# Patient Record
Sex: Female | Born: 1995 | Race: Black or African American | Hispanic: No | Marital: Single | Smoking: Never smoker
Health system: Southern US, Community
[De-identification: ages and names within clinical notes are randomized; demographics above are authoritative.]

## PROBLEM LIST (undated history)

## (undated) DIAGNOSIS — F419 Anxiety disorder, unspecified: Secondary | ICD-10-CM

## (undated) DIAGNOSIS — G43909 Migraine, unspecified, not intractable, without status migrainosus: Secondary | ICD-10-CM

## (undated) DIAGNOSIS — K219 Gastro-esophageal reflux disease without esophagitis: Secondary | ICD-10-CM

## (undated) HISTORY — PX: TONSILLECTOMY: SUR1361

## (undated) HISTORY — PX: ADENOIDECTOMY: SUR15

---
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
## 2018-09-09 ENCOUNTER — Encounter: Payer: Self-pay | Admitting: Emergency Medicine

## 2018-09-09 ENCOUNTER — Ambulatory Visit
Admission: EM | Admit: 2018-09-09 | Discharge: 2018-09-09 | Disposition: A | Discharge status: Home / Self Care | Payer: BLUE CROSS/BLUE SHIELD | Attending: Family Medicine | Admitting: Family Medicine

## 2018-09-09 ENCOUNTER — Other Ambulatory Visit: Payer: Self-pay

## 2018-09-09 DIAGNOSIS — N760 Acute vaginitis: Secondary | ICD-10-CM

## 2018-09-09 DIAGNOSIS — B9689 Other specified bacterial agents as the cause of diseases classified elsewhere: Secondary | ICD-10-CM | POA: Diagnosis not present

## 2018-09-09 LAB — WET PREP, GENITAL
SPERM: NONE SEEN
TRICH WET PREP: NONE SEEN
Yeast Wet Prep HPF POC: NONE SEEN

## 2018-09-09 LAB — CHLAMYDIA/NGC RT PCR (ARMC ONLY)
Chlamydia Tr: NOT DETECTED
N gonorrhoeae: NOT DETECTED

## 2018-09-09 MED ORDER — METRONIDAZOLE 500 MG PO TABS: 500.0000 mg | ORAL_TABLET | Freq: Two times a day (BID) | ORAL | 0 refills | Status: DC

## 2018-09-09 NOTE — Discharge Instructions (Addendum)
Take medication as prescribed. Rest. Drink plenty of fluids.  ° °Follow up with your primary care physician this week as needed. Return to Urgent care for new or worsening concerns.  ° °

## 2018-09-09 NOTE — ED Triage Notes (Signed)
Patient c/o vaginal discharge and vaginal itching that started after her period ended couple of days ago.

## 2018-09-09 NOTE — ED Provider Notes (Signed)
[facility] ____________________________________________  Time seen: Approximately 8:31 PM  I have reviewed the triage vital signs and the nursing notes.   HISTORY  Chief Complaint Vaginal Discharge   HPI Claudia Morris is a 22 y.o. female presents for evaluation of vaginal itching.  Patient reports she just completed her menstrual cycle, and reports just prior to mental cycle she noticed some vaginal discharge.  States it then resolved and since finishing her menstrual she is having some vaginal itching.  Denies any rash, sores, current discharge, vaginal pain, abdominal pain or back pain.  No accompanying fevers.  Reports sexually active with same partner, request to have gonorrhea and Chlamydia testing completed but denies other concerns of other STDs and declines other STD testing.  Denies concerns of pregnancy.  No urinary frequency, urinary urgency or burning with urination.  Denies aggravating or alleviating factors.  Reports otherwise doing well denies other complaints.  No pain at this time.  [employer] [facility] [employer]: PCP Patient's last menstrual period was 09/02/2018 (exact date).Denies pregnancy.    History reviewed. No pertinent past medical history.  There are no active problems to display for this patient.   Past Surgical History:  Procedure Laterality Date  . ADENOIDECTOMY    . TONSILLECTOMY       No current facility-administered medications for this encounter.   Current Outpatient Medications:  .  Norgestimate-Ethinyl Estradiol Triphasic (TRI-SPRINTEC) 0.18/0.215/0.25 MG-35 MCG tablet, Take 1 tablet by mouth daily., Disp: , Rfl:  .  metroNIDAZOLE (FLAGYL) 500 MG tablet, Take 1 tablet (500 mg total) by mouth 2 (two) times daily., Disp: 14 tablet, Rfl: 0  Allergies Patient has no known allergies.  Family History  Problem Relation Age of Onset  . Diabetes Mother   . Hypertension Father     Social History Social History   Tobacco Use    . Smoking status: Never Smoker  . Smokeless tobacco: Never Used  Substance Use Topics  . Alcohol use: Never    Frequency: Never  . Drug use: Never    Review of Systems Constitutional: No fever Cardiovascular: Denies chest pain. Respiratory: Denies shortness of breath. Gastrointestinal: No abdominal pain.  No nausea, no vomiting.  No diarrhea.   Genitourinary: Negative for dysuria. Musculoskeletal: Negative for back pain. Skin: Negative for rash.   ____________________________________________   PHYSICAL EXAM:  VITAL SIGNS: ED Triage Vitals  Enc Vitals Group     BP 09/09/18 1937 136/86     Pulse Rate 09/09/18 1937 97     Resp 09/09/18 1937 14     Temp 09/09/18 1937 98.3 F (36.8 C)     Temp Source 09/09/18 1937 Oral     SpO2 09/09/18 1937 100 %     Weight 09/09/18 1934 175 lb (79.4 kg)     Height 09/09/18 1934 5\' 7"  (1.702 m)     Head Circumference --      Peak Flow --      Pain Score 09/09/18 1934 0     Pain Loc --      Pain Edu? --      Excl. in GC? --     Constitutional: Alert and oriented. Well appearing and in no acute distress. ENT      Head: Normocephalic and atraumatic. Cardiovascular: Normal rate, regular rhythm. Grossly normal heart sounds.  Good peripheral circulation. Respiratory: Normal respiratory effort without tachypnea nor retractions. Breath sounds are clear and equal bilaterally. No wheezes, rales, rhonchi. Gastrointestinal: Soft and nontender. No CVA  tenderness. Musculoskeletal:  No midline cervical, thoracic or lumbar tenderness to palpation. Neurologic:  Normal speech and language. Speech is normal. No gait instability.  Skin:  Skin is warm, dry Psychiatric: Mood and affect are normal. Speech and behavior are normal. Patient exhibits appropriate insight and judgment   ___________________________________________   LABS (all labs ordered are listed, but only abnormal results are displayed)  Labs Reviewed  WET PREP, GENITAL - Abnormal;  Notable for the following components:      Result Value   Clue Cells Wet Prep HPF POC PRESENT (*)    WBC, Wet Prep HPF POC FEW (*)    All other components within normal limits  CHLAMYDIA/NGC RT PCR ([facility] ONLY)    PROCEDURES Procedures   INITIAL IMPRESSION / ASSESSMENT AND PLAN / ED COURSE  Pertinent labs & imaging results that were available during my care of the patient were reviewed by me and considered in my medical decision making (see chart for details).  Well-appearing patient.  No acute distress.  Reason for evaluation of vaginal itching.  Request to have gonorrhea and Chlamydia testing performed, but declines any other STD testing at this time.  Patient declines pelvic, elected self wet prep.  Wet prep positive for clue cells.  Discussed treatment options with patient.  Will treat with oral Flagyl.  Encourage rest, fluids, supportive care, pelvic rest. Discussed indication, risks and benefits of medications with patient.  Discussed follow up and return parameters including no resolution or any worsening concerns. Patient verbalized understanding and agreed to plan.   ____________________________________________   FINAL CLINICAL IMPRESSION(S) / ED DIAGNOSES  Final diagnoses:  BV (bacterial vaginosis)     ED Discharge Orders         Ordered    metroNIDAZOLE (FLAGYL) 500 MG tablet  2 times daily     09/09/18 1958           Note: This dictation was prepared with Dragon dictation along with smaller phrase technology. Any transcriptional errors that result from this process are unintentional.         Renford Dills, NP 09/09/18 2034

## 2019-05-01 ENCOUNTER — Ambulatory Visit
Admission: EM | Admit: 2019-05-01 | Discharge: 2019-05-01 | Disposition: A | Discharge status: Home / Self Care | Payer: BLUE CROSS/BLUE SHIELD | Attending: Family Medicine | Admitting: Family Medicine

## 2019-05-01 ENCOUNTER — Encounter: Payer: Self-pay | Admitting: Emergency Medicine

## 2019-05-01 ENCOUNTER — Other Ambulatory Visit: Payer: Self-pay

## 2019-05-01 DIAGNOSIS — F419 Anxiety disorder, unspecified: Secondary | ICD-10-CM

## 2019-05-01 HISTORY — DX: Gastro-esophageal reflux disease without esophagitis: K21.9

## 2019-05-01 HISTORY — DX: Anxiety disorder, unspecified: F41.9

## 2019-05-01 NOTE — Discharge Instructions (Signed)
Discuss increasing or changing your medication with your doctor.  Consider seeing a counselor/therapist - [facility].  Take care  Dr. Lacinda Axon

## 2019-05-01 NOTE — ED Triage Notes (Signed)
Patient c/o feeling anxious for the past 2-3 days. Patient states that her anxiety was worse today.  Patient reports pain in her left shoulder. Patient currently is on medication for anxiety.

## 2019-05-02 NOTE — ED Provider Notes (Signed)
[facility]    CSN: [id number] Arrival date & time: 05/01/19  1539  History   Chief Complaint Chief Complaint  Patient presents with  . Anxiety   HPI   23 year old female presents with anxiety, chest pain/tightness, and left shoulder pain.  The patient's third visit this month.  She has been seen for same symptoms in the ER on 6/12 and 6/25.  Patient has underlying anxiety states that she is followed by primary care physician.  She states that she is currently on mirtazapine for anxiety.  Patient states that for the past 2 to 3 days she has had worsening anxiety.  She feels very anxious.  She states that she is unsure why.  She does note that she is worried about school and her future.  Patient endorses some recent chest tightness which she states is currently improved.  She also has ongoing left shoulder pain.  Patient thought it best that she be examined.  Her pain is currently 5/10 in severity.  Denies shortness of breath.  No other reported symptoms.  No other complaints.  PMH, Surgical Hx, Family Hx, Social History reviewed and updated as below.  Past Medical History:  Diagnosis Date  . Anxiety   . GERD (gastroesophageal reflux disease)    Past Surgical History:  Procedure Laterality Date  . ADENOIDECTOMY    . TONSILLECTOMY      OB History   No obstetric history on file.    Home Medications    Prior to Admission medications   Medication Sig Start Date End Date Taking? Authorizing Provider  Norgestimate-Ethinyl Estradiol Triphasic (TRI-SPRINTEC) 0.18/0.215/0.25 MG-35 MCG tablet Take 1 tablet by mouth daily.   Yes [provider]  pantoprazole (PROTONIX) 40 MG tablet  04/30/19  Yes [provider]  metroNIDAZOLE (FLAGYL) 500 MG tablet Take 1 tablet (500 mg total) by mouth 2 (two) times daily. 09/09/18   Marylene Land, NP    Family History Family History  Problem Relation Age of Onset  . Diabetes Mother   . Hypertension Father      Social History Social History   Tobacco Use  . Smoking status: Never Smoker  . Smokeless tobacco: Never Used  Substance Use Topics  . Alcohol use: Never    Frequency: Never  . Drug use: Never     Allergies   Patient has no known allergies.   Review of Systems Review of Systems  Respiratory: Positive for chest tightness.   Musculoskeletal:       Left shoulder pain.  Psychiatric/Behavioral: The patient is nervous/anxious.    Physical Exam Triage Vital Signs ED Triage Vitals  Enc Vitals Group     BP 05/01/19 1601 (!) 131/92     Pulse Rate 05/01/19 1601 (!) 129     Resp 05/01/19 1601 16     Temp 05/01/19 1601 98.4 F (36.9 C)     Temp Source 05/01/19 1601 Oral     SpO2 05/01/19 1601 99 %     Weight 05/01/19 1557 155 lb (70.3 kg)     Height 05/01/19 1557 5\' 7"  (1.702 m)     Head Circumference --      Peak Flow --      Pain Score 05/01/19 1557 5     Pain Loc --      Pain Edu? --      Excl. in [place]? --    Updated Vital Signs BP (!) 131/92 (BP Location: Left Arm)   Pulse (!) 129  Temp 98.4 F (36.9 C) (Oral)   Resp 16   Ht 5\' 7"  (1.702 m)   Wt 70.3 kg   LMP 04/10/2019 (Approximate)   SpO2 99%   BMI 24.28 kg/m   Visual Acuity Right Eye Distance:   Left Eye Distance:   Bilateral Distance:    Right Eye Near:   Left Eye Near:    Bilateral Near:     Physical Exam Vitals signs and nursing note reviewed.  Constitutional:      Appearance: Normal appearance.     Comments: Patient appears very anxious but is in no acute distress.  HENT:     Head: Normocephalic and atraumatic.  Eyes:     General:        Right eye: No discharge.        Left eye: No discharge.     Conjunctiva/sclera: Conjunctivae normal.  Cardiovascular:     Rate and Rhythm: Regular rhythm. Tachycardia present.  Pulmonary:     Effort: Pulmonary effort is normal.     Breath sounds: Normal breath sounds. No wheezing or rales.  Musculoskeletal:     Comments: Patient with tenderness along  left trapezius.  Neurological:     Mental Status: She is alert.  Psychiatric:     Comments: Patient very anxious.  Psychomotor agitation noted.    UC Treatments / Results  Labs (all labs ordered are listed, but only abnormal results are displayed) Labs Reviewed - No data to display  EKG None  Radiology No results found.  Procedures Procedures (including critical care time)  Medications Ordered in UC Medications - No data to display  Initial Impression / Assessment and Plan / UC Course  I have reviewed the triage vital signs and the nursing notes.  Pertinent labs & imaging results that were available during my care of the patient were reviewed by me and considered in my medical decision making (see chart for details).    23 year old female presents with uncontrolled anxiety.  Advised her to follow-up with her primary care physician to discuss changing medication or increasing her current medication.  Advised to see counselor/therapist.  Recommendation given.  Supportive care.  Final Clinical Impressions(s) / UC Diagnoses   Final diagnoses:  Anxiety     Discharge Instructions     Discuss increasing or changing your medication with your doctor.  Consider seeing a counselor/therapist - [facility].  Take care  Dr. Adriana Simasook    ED Prescriptions    None     Controlled Substance Prescriptions [place] Controlled Substance Registry consulted? Not Applicable   Tommie SamsCook, Keiko Myricks G, DO 05/02/19 1014

## 2019-06-03 ENCOUNTER — Ambulatory Visit: Payer: BC Managed Care – PPO | Admitting: Gastroenterology

## 2019-06-03 ENCOUNTER — Other Ambulatory Visit: Payer: Self-pay

## 2019-06-10 ENCOUNTER — Ambulatory Visit (INDEPENDENT_AMBULATORY_CARE_PROVIDER_SITE_OTHER): Payer: BC Managed Care – PPO

## 2019-06-10 ENCOUNTER — Other Ambulatory Visit: Payer: Self-pay

## 2019-06-10 ENCOUNTER — Ambulatory Visit
Admission: EM | Admit: 2019-06-10 | Discharge: 2019-06-10 | Disposition: A | Discharge status: Home / Self Care | Payer: BC Managed Care – PPO | Attending: Family | Admitting: Family

## 2019-06-10 ENCOUNTER — Encounter: Payer: Self-pay | Admitting: Emergency Medicine

## 2019-06-10 DIAGNOSIS — M79662 Pain in left lower leg: Secondary | ICD-10-CM

## 2019-06-10 NOTE — ED Triage Notes (Signed)
Patient c/o left leg pain that started 1 month ago. She states it feels better only when she wears the brace on her leg.

## 2019-06-10 NOTE — ED Notes (Signed)
Ace wrap applied to left lower leg for comfort. Patient tolerated well.

## 2019-06-10 NOTE — Discharge Instructions (Addendum)
Recommend try using an ace wrap for support and comfort. Encouraged to continue Naproxen as needed. Recommend call [facility] today to schedule appointment for further evaluation.

## 2019-06-10 NOTE — ED Provider Notes (Signed)
[facility]    CSN: [id number] Arrival date & time: 06/10/19  0903     History   Chief Complaint Chief Complaint  Patient presents with  . Leg Pain    HPI Claudia Pinesaylor M Morris is a 23 y.o. female.   23 year old female presents with left lower leg pain that started about 1 month ago. No distinct injury but [facility] and they thought she had a muscle strain or possible stress fracture. Was given Naproxen with some relief. Also purchased a lower leg/ankle brace which helps but pain has gotten much worse in the past week. Indicates elevating leg helps but ice and heat has not. Denies any numbness. Concerned over etiology. Requesting x-ray evaluation today. Other chronic health issues include GERD. Currently on Protonix and Tri-Sprintec daily.   The history is provided by the patient.    Past Medical History:  Diagnosis Date  . Anxiety   . GERD (gastroesophageal reflux disease)     There are no active problems to display for this patient.   Past Surgical History:  Procedure Laterality Date  . ADENOIDECTOMY    . TONSILLECTOMY      OB History   No obstetric history on file.      Home Medications    Prior to Admission medications   Medication Sig Start Date End Date Taking? Authorizing Provider  Norgestimate-Ethinyl Estradiol Triphasic (TRI-SPRINTEC) 0.18/0.215/0.25 MG-35 MCG tablet Take 1 tablet by mouth daily.   Yes [provider]  pantoprazole (PROTONIX) 40 MG tablet  04/30/19  Yes [provider]  norgestimate-ethinyl estradiol (ORTHO-CYCLEN) 0.25-35 MG-MCG tablet Take by mouth.  06/10/19  [provider]  omeprazole (PRILOSEC) 20 MG capsule TK ONE C PO D FOR REFLUX 04/16/19 06/10/19  [provider]  sucralfate (CARAFATE) 1 g tablet  04/19/19 06/10/19  [provider]    Family History Family History  Problem Relation Age of Onset  . Diabetes Mother   . Hypertension Father     Social  History Social History   Tobacco Use  . Smoking status: Never Smoker  . Smokeless tobacco: Never Used  Substance Use Topics  . Alcohol use: Never    Frequency: Never  . Drug use: Never     Allergies   Patient has no known allergies.   Review of Systems Review of Systems  Constitutional: Negative for activity change, appetite change, chills, fatigue and fever.  Respiratory: Negative for cough, chest tightness, shortness of breath and wheezing.   Cardiovascular: Negative for chest pain, palpitations and leg swelling.  Gastrointestinal: Negative for nausea and vomiting.  Musculoskeletal: Positive for arthralgias and myalgias. Negative for back pain and joint swelling.  Skin: Negative for color change, rash and wound.  Allergic/Immunologic: Negative for immunocompromised state.  Neurological: Negative for dizziness, tremors, seizures, syncope, weakness, light-headedness, numbness and headaches.  Hematological: Negative for adenopathy. Does not bruise/bleed easily.     Physical Exam Triage Vital Signs ED Triage Vitals  Enc Vitals Group     BP 06/10/19 0927 (!) 138/100     Pulse Rate 06/10/19 0927 86     Resp 06/10/19 0927 18     Temp 06/10/19 0927 98.8 F (37.1 C)     Temp Source 06/10/19 0927 Oral     SpO2 06/10/19 0927 100 %     Weight 06/10/19 0928 155 lb (70.3 kg)     Height 06/10/19 0928 5\' 7"  (1.702 m)     Head  Circumference --      Peak Flow --      Pain Score 06/10/19 0927 7     Pain Loc --      Pain Edu? --      Excl. in GC? --    No data found.  Updated Vital Signs BP (!) 138/100 (BP Location: Right Arm)   Pulse 86   Temp 98.8 F (37.1 C) (Oral)   Resp 18   Ht 5\' 7"  (1.702 m)   Wt 155 lb (70.3 kg)   LMP 05/27/2019   SpO2 100%   BMI 24.28 kg/m   Visual Acuity Right Eye Distance:   Left Eye Distance:   Bilateral Distance:    Right Eye Near:   Left Eye Near:    Bilateral Near:     Physical Exam Vitals signs and nursing note reviewed.   Constitutional:      General: She is awake. She is not in acute distress.    Appearance: Normal appearance. She is well-developed and well-groomed. She is not ill-appearing.     Comments: Patient sitting comfortably on exam table in no acute distress but appears to be in pain and has leg extended on table for comfort.   HENT:     Head: Normocephalic and atraumatic.  Eyes:     Extraocular Movements: Extraocular movements intact.     Conjunctiva/sclera: Conjunctivae normal.  Cardiovascular:     Rate and Rhythm: Normal rate.  Pulmonary:     Effort: Pulmonary effort is normal.  Musculoskeletal: Normal range of motion.        General: Tenderness present.     Right lower leg: No edema.     Left lower leg: She exhibits tenderness. She exhibits no swelling, no deformity and no laceration. No edema.       Legs:     Comments: Has full range of motion of left hip, knee and ankle. Slight pain along anterior central aspect of tibia with Dorsal flexion of left foot. Slightly tender just below tibial tuberosity. No swelling, redness or rash seen. Good distal pulses and capillary refill. No neuro deficits noted.   Skin:    General: Skin is warm and dry.     Capillary Refill: Capillary refill takes less than 2 seconds.     Findings: No erythema or rash.  Neurological:     General: No focal deficit present.     Mental Status: She is alert and oriented to person, place, and time.     Sensory: Sensation is intact.     Motor: Motor function is intact.     Gait: Gait is intact.  Psychiatric:        Mood and Affect: Mood normal.        Behavior: Behavior normal. Behavior is cooperative.        Thought Content: Thought content normal.        Judgment: Judgment normal.      UC Treatments / Results  Labs (all labs ordered are listed, but only abnormal results are displayed) Labs Reviewed - No data to display  EKG   Radiology Dg Tibia/fibula Left  Result Date: 06/10/2019 CLINICAL DATA:   Central LEFT leg pain for over a month worsened in past week, no known injury EXAM: LEFT TIBIA AND FIBULA - 2 VIEW COMPARISON:  None FINDINGS: Osseous mineralization normal. Joint spaces preserved. No fracture, dislocation, or bone destruction. IMPRESSION: Normal exam. Electronically Signed   By: Ulyses SouthwardMark  Boles M.D.   On:  06/10/2019 10:24    Procedures Procedures (including critical care time)  Medications Ordered in UC Medications - No data to display  Initial Impression / Assessment and Plan / UC Course  I have reviewed the triage vital signs and the nursing notes.  Pertinent labs & imaging results that were available during my care of the patient were reviewed by me and considered in my medical decision making (see chart for details).    Reviewed x-ray results with patient- no fracture or other distinct abnormality detected. Discussed that she may still have continued "shin splints" or muscle/tendon strain. Recommend using an ace wrap instead of brace for support. Continue to elevate leg as much as possible and use Naproxen as directed. Recommend contact [facility] today to schedule appointment for further evaluation.  Final Clinical Impressions(s) / UC Diagnoses   Final diagnoses:  Pain in left lower leg     Discharge Instructions     Recommend try using an ace wrap for support and comfort. Encouraged to continue Naproxen as needed. Recommend call [facility] today to schedule appointment for further evaluation.     ED Prescriptions    None     Controlled Substance Prescriptions [place] Controlled Substance Registry consulted? Not Applicable   Katy Apo, NP 06/10/19 1723

## 2019-06-21 ENCOUNTER — Other Ambulatory Visit: Payer: Self-pay

## 2019-06-21 ENCOUNTER — Encounter: Payer: Self-pay | Admitting: Emergency Medicine

## 2019-06-21 ENCOUNTER — Emergency Department: Payer: BC Managed Care – PPO

## 2019-06-21 ENCOUNTER — Emergency Department
Admission: EM | Admit: 2019-06-21 | Discharge: 2019-06-21 | Disposition: A | Discharge status: Home / Self Care | Payer: BC Managed Care – PPO | Attending: Student in an Organized Health Care Education/Training Program | Admitting: Student in an Organized Health Care Education/Training Program

## 2019-06-21 DIAGNOSIS — R0789 Other chest pain: Secondary | ICD-10-CM | POA: Diagnosis not present

## 2019-06-21 DIAGNOSIS — R079 Chest pain, unspecified: Secondary | ICD-10-CM | POA: Diagnosis present

## 2019-06-21 DIAGNOSIS — Z79899 Other long term (current) drug therapy: Secondary | ICD-10-CM | POA: Insufficient documentation

## 2019-06-21 LAB — BASIC METABOLIC PANEL
Anion gap: 5 (ref 5–15)
BUN: 14 mg/dL (ref 6–20)
CO2: 24 mmol/L (ref 22–32)
Calcium: 8.9 mg/dL (ref 8.9–10.3)
Chloride: 107 mmol/L (ref 98–111)
Creatinine, Ser: 0.82 mg/dL (ref 0.44–1.00)
GFR calc Af Amer: >60 mL/min (ref >60)
GFR calc non Af Amer: >60 mL/min (ref >60)
Glucose, Bld: 96 mg/dL (ref 70–99)
Potassium: 4 mmol/L (ref 3.5–5.1)
Sodium: 136 mmol/L (ref 135–145)

## 2019-06-21 LAB — CBC
HCT: 39.1 % (ref 36.0–46.0)
Hemoglobin: 12.8 g/dL (ref 12.0–15.0)
MCH: 29 pg (ref 26.0–34.0)
MCHC: 32.7 g/dL (ref 30.0–36.0)
MCV: 88.5 fL (ref 80.0–100.0)
Platelets: 344 10*3/uL (ref 150–400)
RBC: 4.42 MIL/uL (ref 3.87–5.11)
RDW: 11.6 % (ref 11.5–15.5)
WBC: 8.6 10*3/uL (ref 4.0–10.5)
nRBC: 0 % (ref 0.0–0.2)

## 2019-06-21 LAB — TROPONIN I (HIGH SENSITIVITY)
Troponin I (High Sensitivity): <2 ng/L (ref <18)
Troponin I (High Sensitivity): 2 ng/L (ref <18)

## 2019-06-21 LAB — FIBRIN DERIVATIVES D-DIMER (ARMC ONLY): Fibrin derivatives D-dimer (ARMC): 182.53 ng/mL (FEU) (ref 0.00–499.00)

## 2019-06-21 LAB — PREGNANCY, URINE: Preg Test, Ur: NEGATIVE

## 2019-06-21 MED ORDER — LORAZEPAM 1 MG PO TABS
1.0000 mg | ORAL_TABLET | Freq: Once | ORAL | Status: AC
  Administered 2019-06-21: 1 mg via ORAL
  Filled 2019-06-21: qty 1

## 2019-06-21 NOTE — ED Notes (Signed)
Paper copy of dispo

## 2019-06-21 NOTE — ED Notes (Signed)
Pt ambulatory to restroom to obtain urine sample
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
## 2019-06-21 NOTE — ED Notes (Signed)
PT  VOL °

## 2019-06-21 NOTE — ED Notes (Signed)
Patient transported to X-ray 

## 2019-06-21 NOTE — ED Provider Notes (Signed)
[facility] Emergency Department Provider Note    First MD Initiated Contact with Patient 06/21/19 1705     (approximate)  I have reviewed the triage vital signs and the nursing notes.   HISTORY  Chief Complaint Chest Pain    HPI Claudia Morris is a 23 y.o. female [place] past medical history presents the ER for evaluation of right-sided shoulder and chest pain that started earlier today.  Feels like the right side of her hand is numb.  Symptoms initiated after she learned that her sister was in a car wreck.  States this feels different from previous episodes of anxiety.  No nausea or vomiting.  No abdominal pain.  No recent fevers.    Past Medical History:  Diagnosis Date  . Anxiety   . GERD (gastroesophageal reflux disease)    Family History  Problem Relation Age of Onset  . Diabetes Mother   . Hypertension Father    Past Surgical History:  Procedure Laterality Date  . ADENOIDECTOMY    . TONSILLECTOMY     There are no active problems to display for this patient.     Prior to Admission medications   Medication Sig Start Date End Date Taking? Authorizing Provider  Norgestimate-Ethinyl Estradiol Triphasic (TRI-SPRINTEC) 0.18/0.215/0.25 MG-35 MCG tablet Take 1 tablet by mouth daily.    [provider]  pantoprazole (PROTONIX) 40 MG tablet  04/30/19   [provider]  norgestimate-ethinyl estradiol (ORTHO-CYCLEN) 0.25-35 MG-MCG tablet Take by mouth.  06/10/19  [provider]  omeprazole (PRILOSEC) 20 MG capsule TK ONE C PO D FOR REFLUX 04/16/19 06/10/19  [provider]  sucralfate (CARAFATE) 1 g tablet  04/19/19 06/10/19  [provider]    Allergies Patient has no known allergies.    Social History Social History   Tobacco Use  . Smoking status: Never Smoker  . Smokeless tobacco: Never Used  Substance Use Topics  . Alcohol use: Never    Frequency: Never  . Drug use: Never    Review of  Systems Patient denies headaches, rhinorrhea, blurry vision, numbness, shortness of breath, chest pain, edema, cough, abdominal pain, nausea, vomiting, diarrhea, dysuria, fevers, rashes or hallucinations unless otherwise stated above in HPI. ____________________________________________   PHYSICAL EXAM:  VITAL SIGNS: Vitals:   06/21/19 1650 06/21/19 1822  BP:  132/86  Pulse:  92  Resp:  18  Temp: 98.7 F (37.1 C)   SpO2:  99%    Constitutional: Alert and oriented.  Eyes: Conjunctivae are normal.  Head: Atraumatic. Nose: No congestion/rhinnorhea. Mouth/Throat: Mucous membranes are moist.   Neck: No stridor. Painless ROM.  Cardiovascular: Normal rate, regular rhythm. Grossly normal heart sounds.  Good peripheral circulation. Respiratory: Normal respiratory effort.  No retractions. Lungs CTAB. Gastrointestinal: Soft and nontender. No distention. No abdominal bruits. No CVA tenderness. Genitourinary:  Musculoskeletal: No lower extremity tenderness nor edema.  No joint effusions. Neurologic:  Normal speech and language. No gross focal neurologic deficits are appreciated. No facial droop Skin:  Skin is warm, dry and intact. No rash noted. Psychiatric: Mood and affect are normal. Speech and behavior are normal.  ____________________________________________   LABS (all labs ordered are listed, but only abnormal results are displayed)  Results for orders placed or performed during the hospital encounter of 06/21/19 (from the past 24 hour(s))  Basic metabolic panel     Status: None   Collection Time: 06/21/19  4:52 PM  Result Value Ref Range   Sodium 136 135 -  145 mmol/L   Potassium 4.0 3.5 - 5.1 mmol/L   Chloride 107 98 - 111 mmol/L   CO2 24 22 - 32 mmol/L   Glucose, Bld 96 70 - 99 mg/dL   BUN 14 6 - 20 mg/dL   Creatinine, Ser 1.610.82 0.44 - 1.00 mg/dL   Calcium 8.9 8.9 - 09.610.3 mg/dL   GFR calc non Af Amer >60 >60 mL/min   GFR calc Af Amer >60 >60 mL/min   Anion gap 5 5 - 15   CBC     Status: None   Collection Time: 06/21/19  4:52 PM  Result Value Ref Range   WBC 8.6 4.0 - 10.5 K/uL   RBC 4.42 3.87 - 5.11 MIL/uL   Hemoglobin 12.8 12.0 - 15.0 g/dL   HCT 04.539.1 40.936.0 - 81.146.0 %   MCV 88.5 80.0 - 100.0 fL   MCH 29.0 26.0 - 34.0 pg   MCHC 32.7 30.0 - 36.0 g/dL   RDW 91.411.6 78.211.5 - 95.615.5 %   Platelets 344 150 - 400 K/uL   nRBC 0.0 0.0 - 0.2 %  Troponin I (High Sensitivity)     Status: None   Collection Time: 06/21/19  4:52 PM  Result Value Ref Range   Troponin I (High Sensitivity) <2 <18 ng/L  Fibrin derivatives D-Dimer ([facility] only)     Status: None   Collection Time: 06/21/19  5:14 PM  Result Value Ref Range   Fibrin derivatives D-dimer ([facility]) 182.53 0.00 - 499.00 ng/mL (FEU)  Pregnancy, urine     Status: None   Collection Time: 06/21/19  6:51 PM  Result Value Ref Range   Preg Test, Ur NEGATIVE NEGATIVE   ____________________________________________  EKG My review and personal interpretation at Time: 16:50   Indication: chest pain  Rate: 90  Rhythm: sinus Axis: normal Other: normal intervals, no stemi ____________________________________________  RADIOLOGY  I personally reviewed all radiographic images ordered to evaluate for the above acute complaints and reviewed radiology reports and findings.  These findings were personally discussed with the patient.  Please see medical record for radiology report.  ____________________________________________   PROCEDURES  Procedure(s) performed:  Procedures    Critical Care performed: no ____________________________________________   INITIAL IMPRESSION / ASSESSMENT AND PLAN / ED COURSE  Pertinent labs & imaging results that were available during my care of the patient were reviewed by me and considered in my medical decision making (see chart for details).   DDX: ACS, pericarditis, esophagitis, boerhaaves, pe, dissection, pna, bronchitis, costochondritis   Claudia Morris is a 23 y.o. who presents to  the ED with symptoms as described above.  Patient well-appearing nontoxic.  EKG shows no evidence of acute ischemia or dysrhythmia.  She is low risk by heart score with undetectable high-sensitivity troponin.  She is also low risk by Wells criteria and d-dimer is negative.  No evidence of pneumothorax or evidence of infectious process.  Abdominal exam is soft and benign.  Does not seem consistent with pain referred from the abdomen.  Patient with improvement in symptoms after Ativan.  At this point he believe she stable and appropriate for outpatient follow-up.     The patient was evaluated in Emergency Department today for the symptoms described in the history of present illness. He/she was evaluated in the context of the global COVID-19 pandemic, which necessitated consideration that the patient might be at risk for infection with the SARS-CoV-2 virus that causes COVID-19. Institutional protocols and algorithms that pertain to the evaluation  of patients at risk for COVID-19 are in a state of rapid change based on information released by regulatory bodies including the CDC and federal and state organizations. These policies and algorithms were followed during the patient's care in the ED.  As part of my medical decision making, I reviewed the following data within the [place] notes reviewed and incorporated, Labs reviewed, notes from prior ED visits and [place] Controlled Substance Database   ____________________________________________   FINAL CLINICAL IMPRESSION(S) / ED DIAGNOSES  Final diagnoses:  Atypical chest pain      NEW MEDICATIONS STARTED DURING THIS VISIT:  New Prescriptions   No medications on file     Note:  This document was prepared using Dragon voice recognition software and may include unintentional dictation errors.    Merlyn Lot, MD 06/21/19 [phone]

## 2019-06-22 ENCOUNTER — Ambulatory Visit (INDEPENDENT_AMBULATORY_CARE_PROVIDER_SITE_OTHER): Payer: BC Managed Care – PPO | Admitting: Gastroenterology

## 2019-06-22 ENCOUNTER — Encounter: Payer: Self-pay | Admitting: Gastroenterology

## 2019-06-22 ENCOUNTER — Other Ambulatory Visit: Payer: Self-pay

## 2019-06-22 VITALS — BP 137/85 | HR 92 | Temp 98.2°F | Resp 17 | Wt 178.0 lb

## 2019-06-22 DIAGNOSIS — K219 Gastro-esophageal reflux disease without esophagitis: Secondary | ICD-10-CM

## 2019-06-22 NOTE — Patient Instructions (Signed)
Gastroesophageal Reflux Disease, Adult Gastroesophageal reflux (GER) happens when acid from the stomach flows up into the tube that connects the mouth and the stomach (esophagus). Normally, food travels down the esophagus and stays in the stomach to be digested. With GER, food and stomach acid sometimes move back up into the esophagus. You may have a disease called gastroesophageal reflux disease (GERD) if the reflux:  Happens often.  Causes frequent or very bad symptoms.  Causes problems such as damage to the esophagus. When this happens, the esophagus becomes sore and swollen (inflamed). Over time, GERD can make small holes (ulcers) in the lining of the esophagus. What are the causes? This condition is caused by a problem with the muscle between the esophagus and the stomach. When this muscle is weak or not normal, it does not close properly to keep food and acid from coming back up from the stomach. The muscle can be weak because of:  Tobacco use.  Pregnancy.  Having a certain type of hernia (hiatal hernia).  Alcohol use.  Certain foods and drinks, such as coffee, chocolate, onions, and peppermint. What increases the risk? You are more likely to develop this condition if you:  Are overweight.  Have a disease that affects your connective tissue.  Use NSAID medicines. What are the signs or symptoms? Symptoms of this condition include:  Heartburn.  Difficult or painful swallowing.  The feeling of having a lump in the throat.  A bitter taste in the mouth.  Bad breath.  Having a lot of saliva.  Having an upset or bloated stomach.  Belching.  Chest pain. Different conditions can cause chest pain. Make sure you see your doctor if you have chest pain.  Shortness of breath or noisy breathing (wheezing).  Ongoing (chronic) cough or a cough at night.  Wearing away of the surface of teeth (tooth enamel).  Weight loss. How is this treated? Treatment will depend on how  bad your symptoms are. Your doctor may suggest:  Changes to your diet.  Medicine.  Surgery. Follow these instructions at home: Eating and drinking   Follow a diet as told by your doctor. You may need to avoid foods and drinks such as: ? Coffee and tea (with or without caffeine). ? Drinks that contain alcohol. ? Energy drinks and sports drinks. ? Bubbly (carbonated) drinks or sodas. ? Chocolate and cocoa. ? Peppermint and mint flavorings. ? Garlic and onions. ? Horseradish. ? Spicy and acidic foods. These include peppers, chili powder, curry powder, vinegar, hot sauces, and BBQ sauce. ? Citrus fruit juices and citrus fruits, such as oranges, lemons, and limes. ? Tomato-based foods. These include red sauce, chili, salsa, and pizza with red sauce. ? Fried and fatty foods. These include donuts, french fries, potato chips, and high-fat dressings. ? High-fat meats. These include hot dogs, rib eye steak, sausage, ham, and bacon. ? High-fat dairy items, such as whole milk, butter, and cream cheese.  Eat small meals often. Avoid eating large meals.  Avoid drinking large amounts of liquid with your meals.  Avoid eating meals during the 2-3 hours before bedtime.  Avoid lying down right after you eat.  Do not exercise right after you eat. Lifestyle   Do not use any products that contain nicotine or tobacco. These include cigarettes, e-cigarettes, and chewing tobacco. If you need help quitting, ask your doctor.  Try to lower your stress. If you need help doing this, ask your doctor.  If you are overweight, lose an amount   of weight that is healthy for you. Ask your doctor about a safe weight loss goal. General instructions  Pay attention to any changes in your symptoms.  Take over-the-counter and prescription medicines only as told by your doctor. Do not take aspirin, ibuprofen, or other NSAIDs unless your doctor says it is okay.  Wear loose clothes. Do not wear anything tight  around your waist.  Raise (elevate) the head of your bed about 6 inches (15 cm).  Avoid bending over if this makes your symptoms worse.  Keep all follow-up visits as told by your doctor. This is important. Contact a doctor if:  You have new symptoms.  You lose weight and you do not know why.  You have trouble swallowing or it hurts to swallow.  You have wheezing or a cough that keeps happening.  Your symptoms do not get better with treatment.  You have a hoarse voice. Get help right away if:  You have pain in your arms, neck, jaw, teeth, or back.  You feel sweaty, dizzy, or light-headed.  You have chest pain or shortness of breath.  You throw up (vomit) and your throw-up looks like blood or coffee grounds.  You pass out (faint).  Your poop (stool) is bloody or black.  You cannot swallow, drink, or eat. Summary  If a person has gastroesophageal reflux disease (GERD), food and stomach acid move back up into the esophagus and cause symptoms or problems such as damage to the esophagus.  Treatment will depend on how bad your symptoms are.  Follow a diet as told by your doctor.  Take all medicines only as told by your doctor. This information is not intended to replace advice given to you by your health care provider. Make sure you discuss any questions you have with your health care provider. Document Released: 04/08/2008 Document Revised: 04/29/2018 Document Reviewed: 04/29/2018 Elsevier Patient Education  2020 Elsevier Inc. Food Choices for Gastroesophageal Reflux Disease, Adult When you have gastroesophageal reflux disease (GERD), the foods you eat and your eating habits are very important. Choosing the right foods can help ease your discomfort. Think about working with a nutrition specialist (dietitian) to help you make good choices. What are tips for following this plan?  Meals  Choose healthy foods that are low in fat, such as fruits, vegetables, whole grains,  low-fat dairy products, and lean meat, fish, and poultry.  Eat small meals often instead of 3 large meals a day. Eat your meals slowly, and in a place where you are relaxed. Avoid bending over or lying down until 2-3 hours after eating.  Avoid eating meals 2-3 hours before bed.  Avoid drinking a lot of liquid with meals.  Cook foods using methods other than frying. Bake, grill, or broil food instead.  Avoid or limit: ? Chocolate. ? Peppermint or spearmint. ? Alcohol. ? Pepper. ? Black and decaffeinated coffee. ? Black and decaffeinated tea. ? Bubbly (carbonated) soft drinks. ? Caffeinated energy drinks and soft drinks.  Limit high-fat foods such as: ? Fatty meat or fried foods. ? Whole milk, cream, butter, or ice cream. ? Nuts and nut butters. ? Pastries, donuts, and sweets made with butter or shortening.  Avoid foods that cause symptoms. These foods may be different for everyone. Common foods that cause symptoms include: ? Tomatoes. ? Oranges, lemons, and limes. ? Peppers. ? Spicy food. ? Onions and garlic. ? Vinegar. Lifestyle  Maintain a healthy weight. Ask your doctor what weight is healthy for you.   If you need to lose weight, work with your doctor to do so safely.  Exercise for at least 30 minutes for 5 or more days each week, or as told by your doctor.  Wear loose-fitting clothes.  Do not smoke. If you need help quitting, ask your doctor.  Sleep with the head of your bed higher than your feet. Use a wedge under the mattress or blocks under the bed frame to raise the head of the bed. Summary  When you have gastroesophageal reflux disease (GERD), food and lifestyle choices are very important in easing your symptoms.  Eat small meals often instead of 3 large meals a day. Eat your meals slowly, and in a place where you are relaxed.  Limit high-fat foods such as fatty meat or fried foods.  Avoid bending over or lying down until 2-3 hours after eating.  Avoid  peppermint and spearmint, caffeine, alcohol, and chocolate. This information is not intended to replace advice given to you by your health care provider. Make sure you discuss any questions you have with your health care provider. Document Released: 04/21/2012 Document Revised: 02/11/2019 Document Reviewed: 11/26/2016 Elsevier Patient Education  2020 Elsevier Inc.  

## 2019-06-22 NOTE — Progress Notes (Signed)
Claudia Darby, MD [address]  [place]  [place], [place] [address]  Main: [phone]  Fax: [phone]    Gastroenterology Consultation  Referring Provider:     Letta Median, MD Primary Care Physician:  Claudia Morris Primary Gastroenterologist:  Dr. Cephas Morris Reason for Consultation:     Chronic GERD        HPI:   Claudia Morris is a 23 y.o. female referred by [facility]  for consultation & management of chronic GERD.  Patient has been experiencing severe burning sensation in her chest, water brash, acid taste in her mouth as well as nocturnal heartburn since February.  She was taking omeprazole 20 mg as needed.  She initially thought it was an heart attack as the pain was radiating to her right arm, went to ER.  Cardiac etiology was ruled out. For the last 1 month, her symptoms gotten worse.  She was started on Protonix 40 mg twice daily before meals.  She reports that her symptoms have significantly improved at least the nocturnal heartburn.  She does have globus sensation.  She eats chocolates regularly.  She tries to have early dinner, has had of the bed elevated.  She does not drink alcohol, denies smoking.  She takes ibuprofen as needed for bursitis of her ankle, undergoing physical therapy.  She otherwise stays active.  She does have history of anxiety for which she is being treated and reports that it is under control  Patient is accompanied by her mother today who was concerned about an isolated episode of bright red bleeding per rectum few months ago.  At that time, she reports she was constipated.  Her mom was concerned because of family history of IBD in several family members.  However, patient does not have abdominal pain, anemia, rectal bleeding, altered bowel habits.  She reports having regular bowel movements, formed stool daily  NSAIDs: Ibuprofen as needed  Antiplts/Anticoagulants/Anti thrombotics: None   GI Procedures: None She reports family history of Crohn's disease, ulcerative colitis She denies family history of GI malignancy  Past Medical History:  Diagnosis Date  . Anxiety   . GERD (gastroesophageal reflux disease)     Past Surgical History:  Procedure Laterality Date  . ADENOIDECTOMY    . TONSILLECTOMY     Current Outpatient Medications:  .  escitalopram (LEXAPRO) 5 MG tablet, TK 1 T PO D, Disp: , Rfl:  .  mirtazapine (REMERON) 15 MG tablet, Take by mouth., Disp: , Rfl:  .  naproxen (NAPROSYN) 500 MG tablet, naproxen 500 mg tablet, Disp: , Rfl:  .  Norgestimate-Ethinyl Estradiol Triphasic (TRI-SPRINTEC) 0.18/0.215/0.25 MG-35 MCG tablet, Take 1 tablet by mouth daily., Disp: , Rfl:  .  pantoprazole (PROTONIX) 40 MG tablet, , Disp: , Rfl:    Family History  Problem Relation Age of Onset  . Diabetes Mother   . Hypertension Father      Social History   Tobacco Use  . Smoking status: Never Smoker  . Smokeless tobacco: Never Used  Substance Use Topics  . Alcohol use: Never    Frequency: Never  . Drug use: Never    Allergies as of 06/22/2019  . (No Known Allergies)    Review of Systems:    All systems reviewed and negative except where noted in HPI.   Physical Exam:  BP 137/85 (BP Location: Left Arm, Patient Position: Sitting, Cuff Size: Normal)   Pulse 92  Temp 98.2 F (36.8 C)   Resp 17   Wt 178 lb (80.7 kg)   LMP 06/21/2019   BMI 27.88 kg/m  Patient's last menstrual period was 06/21/2019.  General:   Alert,  Well-developed, well-nourished, pleasant and cooperative in NAD Head:  Normocephalic and atraumatic. Eyes:  Sclera clear, no icterus.   Conjunctiva pink. Ears:  Normal auditory acuity. Nose:  No deformity, discharge, or lesions. Mouth:  No deformity or lesions,oropharynx pink & moist. Neck:  Supple; no masses or thyromegaly. Lungs:  Respirations even and unlabored.  Clear throughout to auscultation.   No wheezes, crackles, or rhonchi. No  acute distress. Heart:  Regular rate and rhythm; no murmurs, clicks, rubs, or gallops. Abdomen:  Normal bowel sounds. Soft, non-tender and non-distended without masses, hepatosplenomegaly or hernias noted.  No guarding or rebound tenderness.   Rectal: Not performed Msk:  Symmetrical without gross deformities. Good, equal movement & strength bilaterally. Pulses:  Normal pulses noted. Extremities:  No clubbing or edema.  No cyanosis. Neurologic:  Alert and oriented x3;  grossly normal neurologically. Skin:  Intact without significant lesions or rashes. No jaundice. Psych:  Alert and cooperative. Normal mood and affect.  Imaging Studies: No abdominal imaging  Assessment and Plan:   Claudia Morris is a 23 y.o. female with chronic GERD currently controlled on Protonix 40 mg twice daily.  She does not have any other alarm signs or symptoms.  Discussed in length with her about management of GERD including lifestyle changes, adherence to medication.  Information about antireflux lifestyle provided Advised her to continue Protonix 40 mg twice daily before meals at least for 3 months.  If her symptoms are not under control or has worsening of symptoms, will perform EGD at that time.  Discussed about long-term management of GERD and possibility of antireflux surgery depending on how she responds to medical therapy   Follow up in 3 months   Claudia Repressohini R Adlynn Lowenstein, MD

## 2019-09-23 ENCOUNTER — Ambulatory Visit: Payer: BC Managed Care – PPO | Admitting: Gastroenterology

## 2019-09-23 ENCOUNTER — Encounter: Payer: Self-pay | Admitting: Gastroenterology

## 2019-09-23 DIAGNOSIS — K219 Gastro-esophageal reflux disease without esophagitis: Secondary | ICD-10-CM

## 2020-08-05 ENCOUNTER — Other Ambulatory Visit: Payer: Self-pay

## 2020-08-05 ENCOUNTER — Ambulatory Visit
Admission: EM | Admit: 2020-08-05 | Discharge: 2020-08-05 | Disposition: A | Discharge status: Home / Self Care | Payer: BC Managed Care – PPO | Attending: Physician Assistant | Admitting: Physician Assistant

## 2020-08-05 DIAGNOSIS — W57XXXA Bitten or stung by nonvenomous insect and other nonvenomous arthropods, initial encounter: Secondary | ICD-10-CM

## 2020-08-05 DIAGNOSIS — S40861A Insect bite (nonvenomous) of right upper arm, initial encounter: Secondary | ICD-10-CM | POA: Diagnosis not present

## 2020-08-05 DIAGNOSIS — L03113 Cellulitis of right upper limb: Secondary | ICD-10-CM

## 2020-08-05 MED ORDER — CEPHALEXIN 500 MG PO CAPS: 500.0000 mg | ORAL_CAPSULE | Freq: Four times a day (QID) | ORAL | 0 refills | Status: AC

## 2020-08-05 MED ORDER — PREDNISONE 10 MG PO TABS: ORAL_TABLET | ORAL | 0 refills | Status: DC

## 2020-08-05 NOTE — ED Provider Notes (Signed)
[facility]    CSN: [id number] Arrival date & time: 08/05/20  0841      History   Chief Complaint Chief Complaint  Patient presents with  . Insect Bite    HPI Claudia Morris is a 24 y.o. female presenting for suspected bug or insect bite of the right inner upper arm.  She says she noticed that 2 days ago and the redness seems to be spreading.  She says that the area is warm and it hurts to touch.  She has tried warm compresses without relief.  Patient denies any fever.  She says there are 2 bumps in the center but denies any drainage.  Patient denies injury.  She says she did not see what bit or stung her she just noticed it.  Patient denies any history of MRSA.  No other concerns today.  HPI  Past Medical History:  Diagnosis Date  . Anxiety   . GERD (gastroesophageal reflux disease)     There are no problems to display for this patient.   Past Surgical History:  Procedure Laterality Date  . ADENOIDECTOMY    . TONSILLECTOMY      OB History   No obstetric history on file.      Home Medications    Prior to Admission medications   Medication Sig Start Date End Date Taking? Authorizing Provider  cephALEXin (KEFLEX) 500 MG capsule Take 1 capsule (500 mg total) by mouth 4 (four) times daily for 7 days. 08/05/20 08/12/20  Eusebio Friendly B, PA-C  escitalopram (LEXAPRO) 5 MG tablet TK 1 T PO D 06/12/19   [provider]  mirtazapine (REMERON) 15 MG tablet Take by mouth.    [provider]  naproxen (NAPROSYN) 500 MG tablet naproxen 500 mg tablet    [provider]  Norgestimate-Ethinyl Estradiol Triphasic (TRI-SPRINTEC) 0.18/0.215/0.25 MG-35 MCG tablet Take 1 tablet by mouth daily.    [provider]  pantoprazole (PROTONIX) 40 MG tablet  04/30/19   [provider]  predniSONE (DELTASONE) 10 MG tablet Take 6 tablets by mouth today and decrease by 1 tablet for 5 more days. 08/05/20   Shirlee Latch, PA-C    norgestimate-ethinyl estradiol (ORTHO-CYCLEN) 0.25-35 MG-MCG tablet Take by mouth.  06/10/19  [provider]  omeprazole (PRILOSEC) 20 MG capsule TK ONE C PO D FOR REFLUX 04/16/19 06/10/19  [provider]  sucralfate (CARAFATE) 1 g tablet  04/19/19 06/10/19  [provider]    Family History Family History  Problem Relation Age of Onset  . Diabetes Mother   . Hypertension Father     Social History Social History   Tobacco Use  . Smoking status: Never Smoker  . Smokeless tobacco: Never Used  Vaping Use  . Vaping Use: Never used  Substance Use Topics  . Alcohol use: Never  . Drug use: Never     Allergies   Patient has no known allergies.   Review of Systems Review of Systems  Constitutional: Negative for fatigue and fever.  Gastrointestinal: Negative for nausea and vomiting.  Musculoskeletal: Negative for arthralgias, joint swelling and myalgias.  Skin: Positive for color change and rash. Negative for wound.  Neurological: Negative for weakness and headaches.     Physical Exam Triage Vital Signs ED Triage Vitals  Enc Vitals Group     BP 08/05/20 0907 122/65     Pulse Rate 08/05/20 0907 73     Resp 08/05/20 0907 17     Temp  08/05/20 0907 98.3 F (36.8 C)     Temp Source 08/05/20 0907 Oral     SpO2 08/05/20 0907 100 %     Weight 08/05/20 0906 185 lb (83.9 kg)     Height 08/05/20 0906 5\' 7"  (1.702 m)     Head Circumference --      Peak Flow --      Pain Score 08/05/20 0905 2     Pain Loc --      Pain Edu? --      Excl. in GC? --    No data found.  Updated Vital Signs BP 122/65 (BP Location: Left Arm)   Pulse 73   Temp 98.3 F (36.8 C) (Oral)   Resp 17   Ht 5\' 7"  (1.702 m)   Wt 185 lb (83.9 kg)   LMP 07/22/2020   SpO2 100%   BMI 28.98 kg/m   Physical Exam Vitals and nursing note reviewed.  Constitutional:      General: She is not in acute distress.    Appearance: Normal appearance. She is not ill-appearing or  toxic-appearing.  HENT:     Head: Normocephalic and atraumatic.  Eyes:     General: No scleral icterus.       Right eye: No discharge.        Left eye: No discharge.     Conjunctiva/sclera: Conjunctivae normal.  Cardiovascular:     Rate and Rhythm: Normal rate and regular rhythm.     Heart sounds: Normal heart sounds.  Pulmonary:     Effort: Pulmonary effort is normal. No respiratory distress.     Breath sounds: Normal breath sounds.  Musculoskeletal:     Cervical back: Neck supple.  Skin:    General: Skin is dry.     Comments: Of the right medial upper arm: This is an area of erythema and slight induration about 4 cm in diameter.  In the center of the lesion there are 2 small pustules.  Area is tender and warm.  Full range of motion of joints of the arm without pain.  Good strength and sensation.  No fluctuance  Neurological:     General: No focal deficit present.     Mental Status: She is alert. Mental status is at baseline.     Motor: No weakness.     Gait: Gait normal.  Psychiatric:        Mood and Affect: Mood normal.        Behavior: Behavior normal.        Thought Content: Thought content normal.      UC Treatments / Results  Labs (all labs ordered are listed, but only abnormal results are displayed) Labs Reviewed - No data to display  EKG   Radiology No results found.  Procedures Procedures (including critical care time)  Medications Ordered in UC Medications - No data to display  Initial Impression / Assessment and Plan / UC Course  I have reviewed the triage vital signs and the nursing notes.  Pertinent labs & imaging results that were available during my care of the patient were reviewed by me and considered in my medical decision making (see chart for details).   Based on exam suspect localized allergic reaction to bug bite/insect sting.  Advised for her to start taking Benadryl and begin prednisone.  Advised to keep the area clean.  Advised her to  outline the area of redness and if it seems to be spreading later today or tomorrow  she should start the Keflex to cover for possible cellulitis.  Otherwise if the symptoms are getting better she does not start the antibiotic and can just continue the prednisone.  Advise her to follow-up for any new or worsening symptoms.  ED precautions discussed.   Final Clinical Impressions(s) / UC Diagnoses   Final diagnoses:  Cellulitis of right arm  Insect bite of right upper arm, initial encounter     Discharge Instructions     The redness and swelling are likely due to a localized reaction from the insect bite/sting.  Begin prednisone and start taking Benadryl.  Monitor the area and if it is looking like the redness is spreading begin Keflex.  There is a possibility that it could be infected since there are 2 pustules and you have quite a bit of tenderness.  Follow-up with our clinic for any worsening symptoms or fever.    ED Prescriptions    Medication Sig Dispense Auth. Provider   predniSONE (DELTASONE) 10 MG tablet Take 6 tablets by mouth today and decrease by 1 tablet for 5 more days. 21 tablet Eusebio Friendly B, PA-C   cephALEXin (KEFLEX) 500 MG capsule Take 1 capsule (500 mg total) by mouth 4 (four) times daily for 7 days. 28 capsule Shirlee Latch, PA-C     PDMP not reviewed this encounter.   Shirlee Latch, PA-C 08/06/20 [phone]

## 2020-08-05 NOTE — Discharge Instructions (Signed)
The redness and swelling are likely due to a localized reaction from the insect bite/sting.  Begin prednisone and start taking Benadryl.  Monitor the area and if it is looking like the redness is spreading begin Keflex.  There is a possibility that it could be infected since there are 2 pustules and you have quite a bit of tenderness.  Follow-up with our clinic for any worsening symptoms or fever.

## 2020-08-05 NOTE — ED Triage Notes (Signed)
Pt reports some kind of insect bite in the last day or two. Inner aspect right upper arm has a raised area with reddened circle around it. Itchy and Is painful to touch.

## 2020-08-08 ENCOUNTER — Ambulatory Visit
Admission: EM | Admit: 2020-08-08 | Discharge: 2020-08-08 | Disposition: A | Discharge status: Home / Self Care | Payer: BC Managed Care – PPO | Attending: Internal Medicine | Admitting: Internal Medicine

## 2020-08-08 ENCOUNTER — Other Ambulatory Visit: Payer: Self-pay

## 2020-08-08 DIAGNOSIS — M26622 Arthralgia of left temporomandibular joint: Secondary | ICD-10-CM

## 2020-08-08 MED ORDER — DICLOFENAC SODIUM 1 % EX GEL: 2.0000 g | Freq: Four times a day (QID) | CUTANEOUS | 0 refills | Status: DC

## 2020-08-08 NOTE — Discharge Instructions (Addendum)
Apply ice or moist heat externally for 20 minutes at a time, 2-3 times a day.  Apply topical diclofenac gel every 6 hours as needed. Hold the Naproxen if you use this.  Eat soft foods and avoid chewing gum, ice, or hard candies.  If your symptoms continue see your dentist.

## 2020-08-08 NOTE — ED Triage Notes (Signed)
Pt was seen here Saturday for insect bite to right upper arm. Prednisone, Benadryl and if worsened was to start ABX (Did not start them). Yesterday she started having left jaw pain, trouble opening and closing the jaw and tenderness to left side of face

## 2020-08-08 NOTE — ED Provider Notes (Signed)
[facility]    CSN: [id number] Arrival date & time: 08/08/20  1600      History   Chief Complaint Chief Complaint  Patient presents with  . Jaw Pain    HPI Claudia Morris is a 23 y.o. female.   24 yo female here for evaluation of left jaw pain and pain with opening he mouth since yesterday. She also reports that she feels the left side of her jaw is swollen. She was seen on Saturday for an insect bite and she wants to be sure this is not tetanus- she had a Td booster in 2016. She has not had fever or trouble swallowing. She denies sinus pressure, or ear fullness, she reports that it hurts to chew but more in the musckle and not her teeth.      Past Medical History:  Diagnosis Date  . Anxiety   . GERD (gastroesophageal reflux disease)     There are no problems to display for this patient.   Past Surgical History:  Procedure Laterality Date  . ADENOIDECTOMY    . TONSILLECTOMY      OB History   No obstetric history on file.      Home Medications    Prior to Admission medications   Medication Sig Start Date End Date Taking? Authorizing Provider  cephALEXin (KEFLEX) 500 MG capsule Take 1 capsule (500 mg total) by mouth 4 (four) times daily for 7 days. 08/05/20 08/12/20  Eusebio Friendly B, PA-C  diclofenac Sodium (VOLTAREN) 1 % GEL Apply 2 g topically 4 (four) times daily. 08/08/20   Becky Augusta, NP  escitalopram (LEXAPRO) 5 MG tablet TK 1 T PO D 06/12/19   [provider]  mirtazapine (REMERON) 15 MG tablet Take by mouth.    [provider]  naproxen (NAPROSYN) 500 MG tablet naproxen 500 mg tablet    [provider]  Norgestimate-Ethinyl Estradiol Triphasic (TRI-SPRINTEC) 0.18/0.215/0.25 MG-35 MCG tablet Take 1 tablet by mouth daily.    [provider]  pantoprazole (PROTONIX) 40 MG tablet  04/30/19   [provider]  predniSONE (DELTASONE) 10 MG tablet Take 6 tablets by mouth today and decrease by 1 tablet for 5  more days. 08/05/20   Shirlee Latch, PA-C  norgestimate-ethinyl estradiol (ORTHO-CYCLEN) 0.25-35 MG-MCG tablet Take by mouth.  06/10/19  [provider]  omeprazole (PRILOSEC) 20 MG capsule TK ONE C PO D FOR REFLUX 04/16/19 06/10/19  [provider]  sucralfate (CARAFATE) 1 g tablet  04/19/19 06/10/19  [provider]    Family History Family History  Problem Relation Age of Onset  . Diabetes Mother   . Hypertension Father     Social History Social History   Tobacco Use  . Smoking status: Never Smoker  . Smokeless tobacco: Never Used  Vaping Use  . Vaping Use: Never used  Substance Use Topics  . Alcohol use: Never  . Drug use: Never     Allergies   Patient has no known allergies.   Review of Systems Review of Systems  Constitutional: Positive for activity change. Negative for fever.  HENT: Negative for congestion and sinus pain.   Respiratory: Negative for cough, shortness of breath and stridor.   Cardiovascular: Negative for chest pain.  Gastrointestinal: Negative for nausea and vomiting.  Musculoskeletal: Positive for arthralgias and myalgias.       Left TMJ  Skin: Negative.   Neurological: Negative for dizziness, syncope and facial asymmetry.  Hematological: Negative.  Psychiatric/Behavioral: Negative.      Physical Exam Triage Vital Signs ED Triage Vitals  Enc Vitals Group     BP 08/08/20 1704 110/64     Pulse Rate 08/08/20 1704 73     Resp 08/08/20 1704 16     Temp 08/08/20 1704 98.5 F (36.9 C)     Temp Source 08/08/20 1704 Oral     SpO2 08/08/20 1704 100 %     Weight 08/08/20 1701 184 lb 15.5 oz (83.9 kg)     Height 08/08/20 1701 5\' 7"  (1.702 m)     Head Circumference --      Peak Flow --      Pain Score 08/08/20 1701 4     Pain Loc --      Pain Edu? --      Excl. in GC? --    No data found.  Updated Vital Signs BP 110/64 (BP Location: Right Arm)   Pulse 73   Temp 98.5 F (36.9 C) (Oral)   Resp 16   Ht 5\' 7"  (1.702  m)   Wt 184 lb 15.5 oz (83.9 kg)   LMP 07/22/2020   SpO2 100%   BMI 28.97 kg/m   Visual Acuity Right Eye Distance:   Left Eye Distance:   Bilateral Distance:    Right Eye Near:   Left Eye Near:    Bilateral Near:     Physical Exam Vitals and nursing note reviewed. Exam conducted with a chaperone present.  Constitutional:      Appearance: Normal appearance.  HENT:     Right Ear: Tympanic membrane, ear canal and external ear normal.     Left Ear: Tympanic membrane, ear canal and external ear normal.     Mouth/Throat:     Lips: Pink. No lesions.     Mouth: Mucous membranes are moist. No injury, lacerations, oral lesions or angioedema.     Dentition: Normal dentition. No dental tenderness, gingival swelling, dental caries, dental abscesses or gum lesions.     Tongue: No lesions. Tongue does not deviate from midline.     Palate: No mass and lesions.     Pharynx: Oropharynx is clear. Uvula midline. No oropharyngeal exudate, posterior oropharyngeal erythema or uvula swelling.     Comments: There is tenderness to the left TMJ. No appreciable swelling noted externally. No bogginess or tenderness to palpation of the floor of the mouth, or internal aspect of the left TMJ and jaw. Dentition appears intact and I am unable to determine any grinding or clenching,.  Eyes:     General: No scleral icterus.    Conjunctiva/sclera: Conjunctivae normal.     Pupils: Pupils are equal, round, and reactive to light.  Cardiovascular:     Rate and Rhythm: Normal rate and regular rhythm.     Pulses: Normal pulses.     Heart sounds: Normal heart sounds. No murmur heard.  No gallop.   Pulmonary:     Effort: Pulmonary effort is normal.     Breath sounds: Normal breath sounds. No stridor. No wheezing, rhonchi or rales.  Musculoskeletal:     Cervical back: Normal range of motion and neck supple. No rigidity or tenderness.  Lymphadenopathy:     Cervical: No cervical adenopathy.  Neurological:     Mental  Status: She is alert.      UC Treatments / Results  Labs (all labs ordered are listed, but only abnormal results are displayed) Labs Reviewed - No data to display  EKG   Radiology No results found.  Procedures Procedures (including critical care time)  Medications Ordered in UC Medications - No data to display  Initial Impression / Assessment and Plan / UC Course  I have reviewed the triage vital signs and the nursing notes.  Pertinent labs & imaging results that were available during my care of the patient were reviewed by me and considered in my medical decision making (see chart for details).   Patient is here for evaluation of left jaw swelling and difficulty opening her mouth- she wasn't to be sure it is not tetanus. She has no fever, she is able to open and close her mouth. There is no popping of clicking of either TMJ. There is tenderness to palpation of the left joint externally. No pharyngeal erythema or edema.  Will d/c home with diclofenac gel for TMJ.    Final Clinical Impressions(s) / UC Diagnoses   Final diagnoses:  Arthralgia of left temporomandibular joint     Discharge Instructions     Apply ice or moist heat externally for 20 minutes at a time, 2-3 times a day.  Apply topical diclofenac gel every 6 hours as needed. Hold the Naproxen if you use this.  Eat soft foods and avoid chewing gum, ice, or hard candies.  If your symptoms continue see your dentist.     ED Prescriptions    Medication Sig Dispense Auth. Provider   diclofenac Sodium (VOLTAREN) 1 % GEL Apply 2 g topically 4 (four) times daily. 22 g Becky Augusta, NP     PDMP not reviewed this encounter.   Becky Augusta, NP 08/08/20 1810

## 2021-03-23 IMAGING — CR LEFT TIBIA AND FIBULA - 2 VIEW
2 series · 2 of 2 positions shown · non-contrast
Comparison: None

CLINICAL DATA: Central LEFT leg pain for over a month worsened in
past week, no known injury

EXAM:
LEFT TIBIA AND FIBULA - 2 VIEW

[tibia ap]
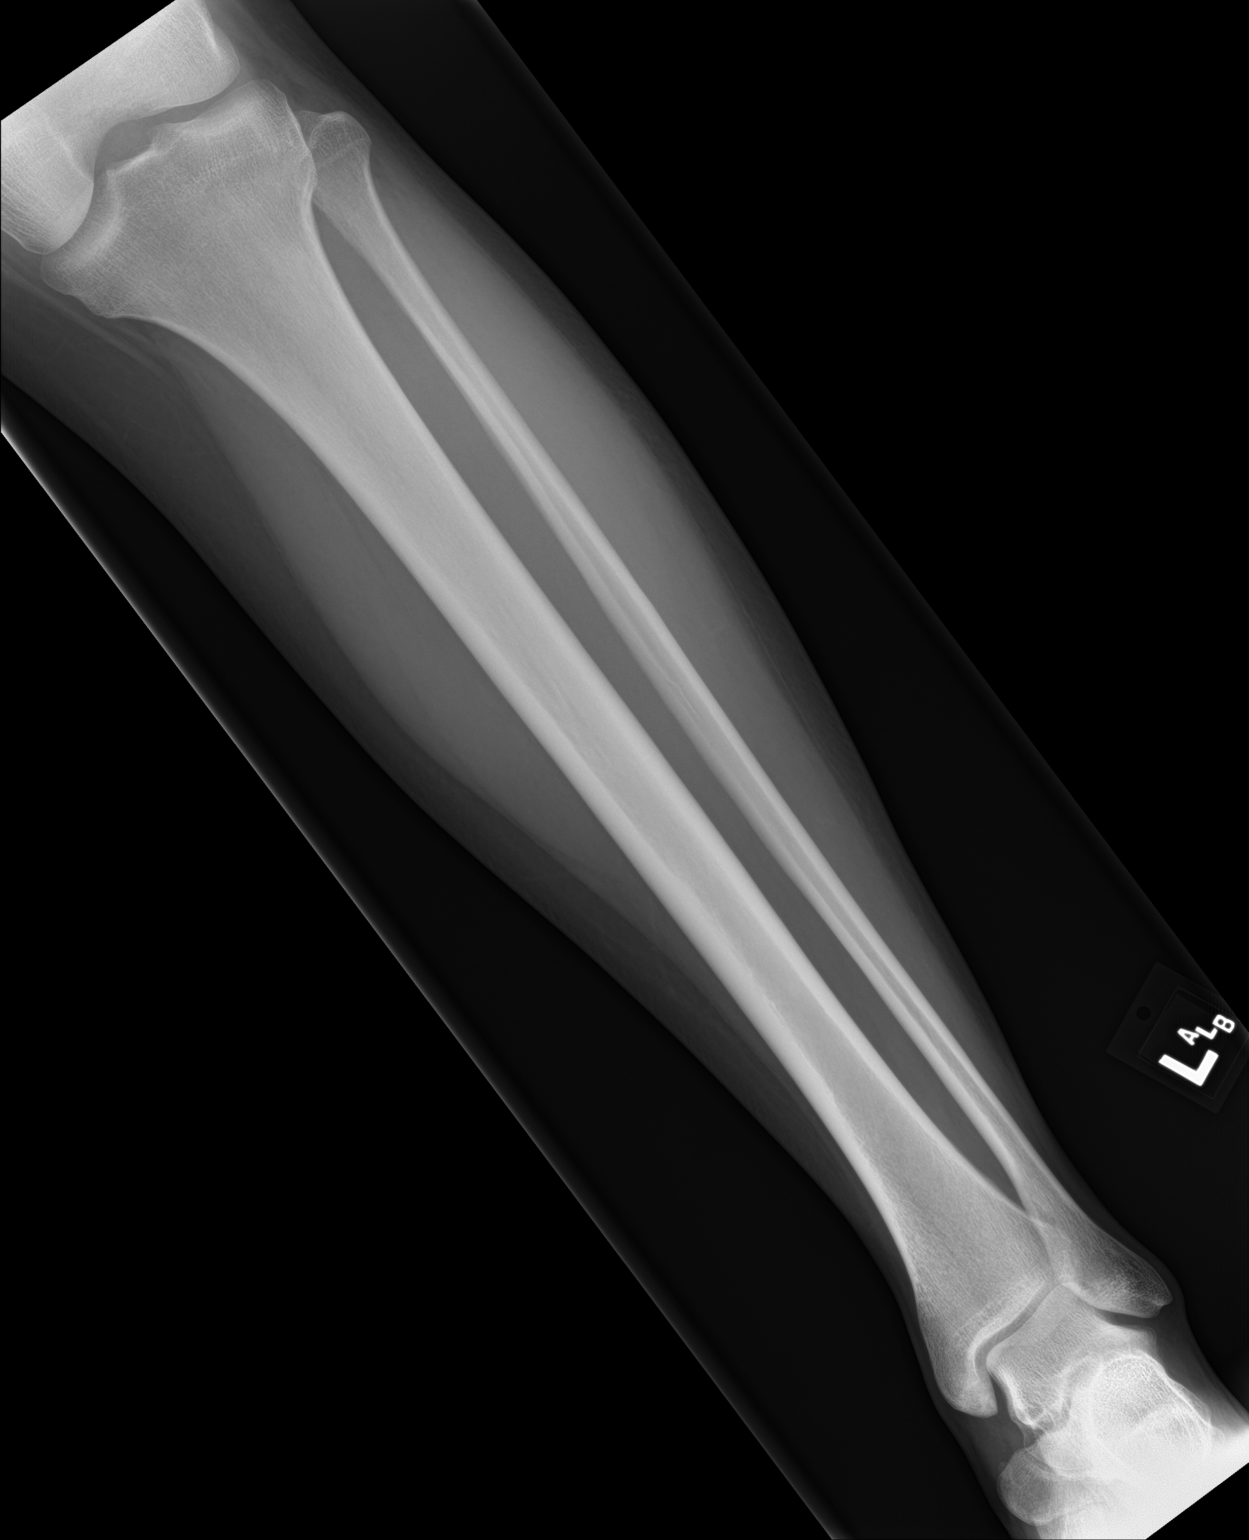

[tibia lat]
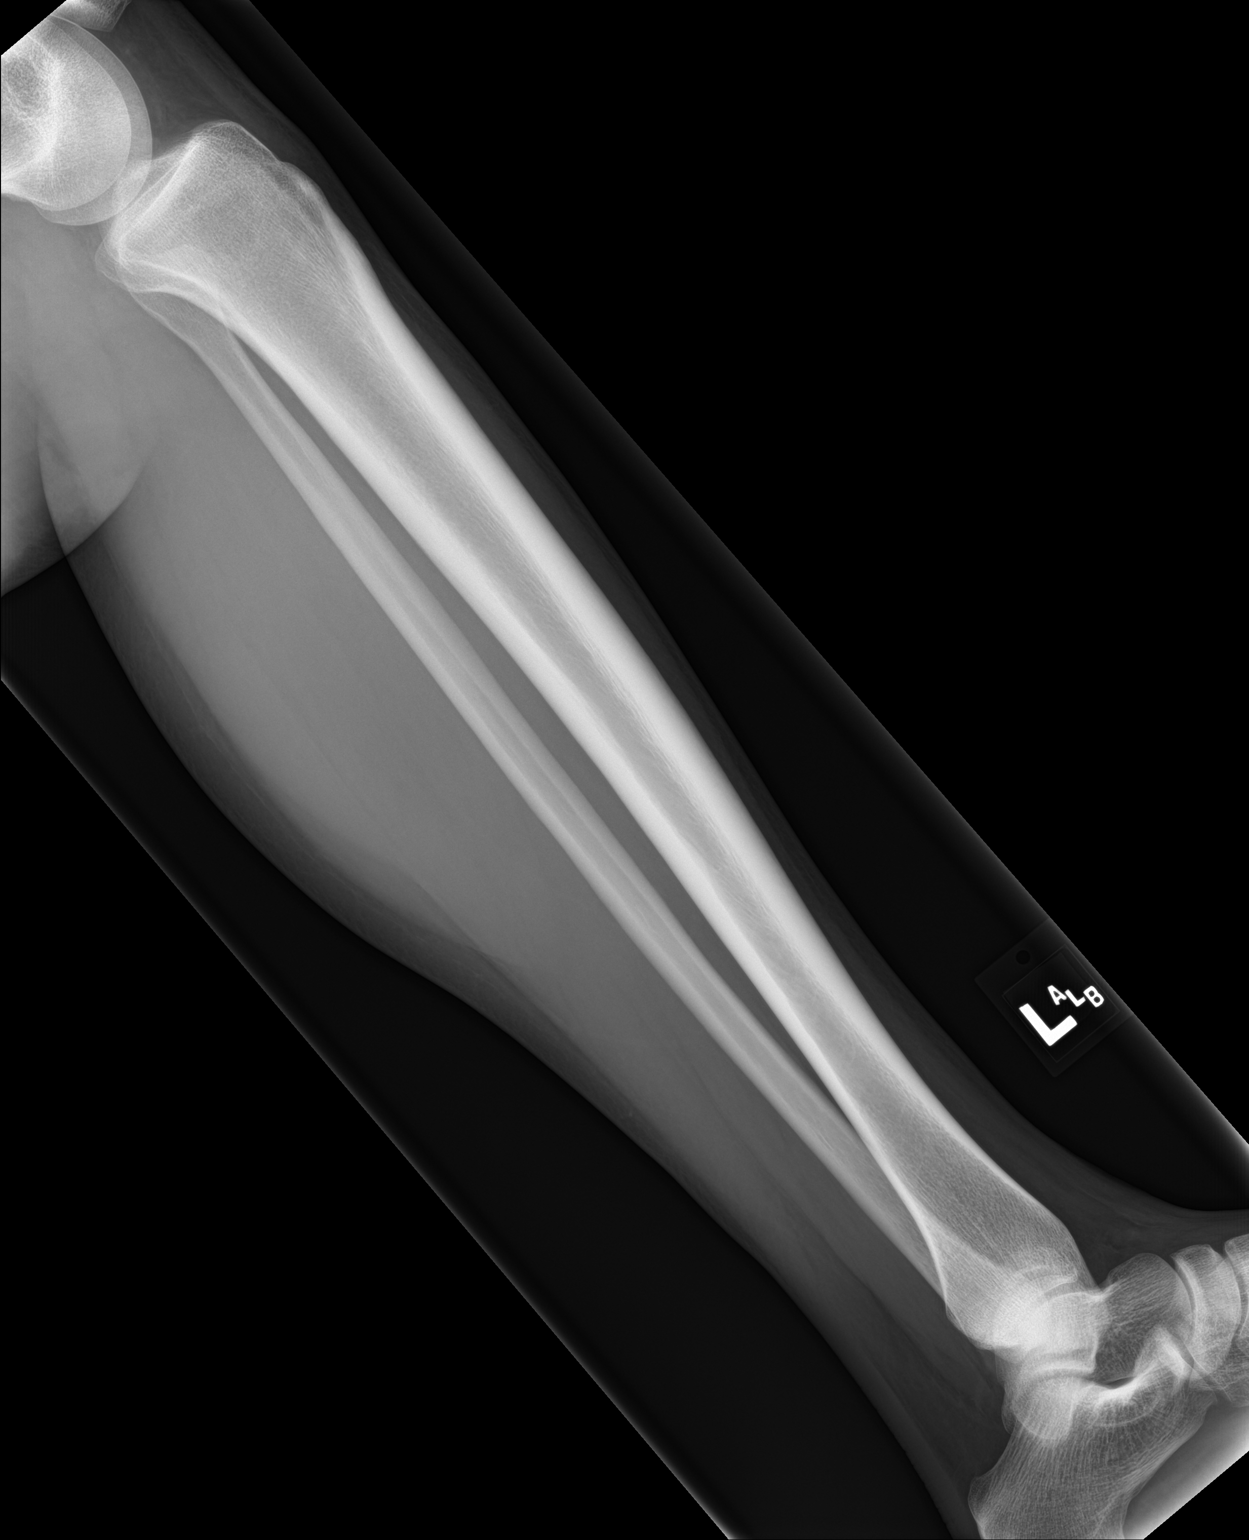

[2 of 2 positions shown; findings below may reference images not displayed]

FINDINGS: Osseous mineralization normal.

Joint spaces preserved.

No fracture, dislocation, or bone destruction.
IMPRESSION: Normal exam.

## 2021-04-03 IMAGING — CR CHEST - 2 VIEW
1 series · 2 of 2 positions shown · non-contrast
Comparison: None.

CLINICAL DATA: Chest pain

EXAM:
CHEST - 2 VIEW

[Series 1: dg chest 2 view · 0.14mm/px · 2 of 2 slices shown]
[im 1/2]
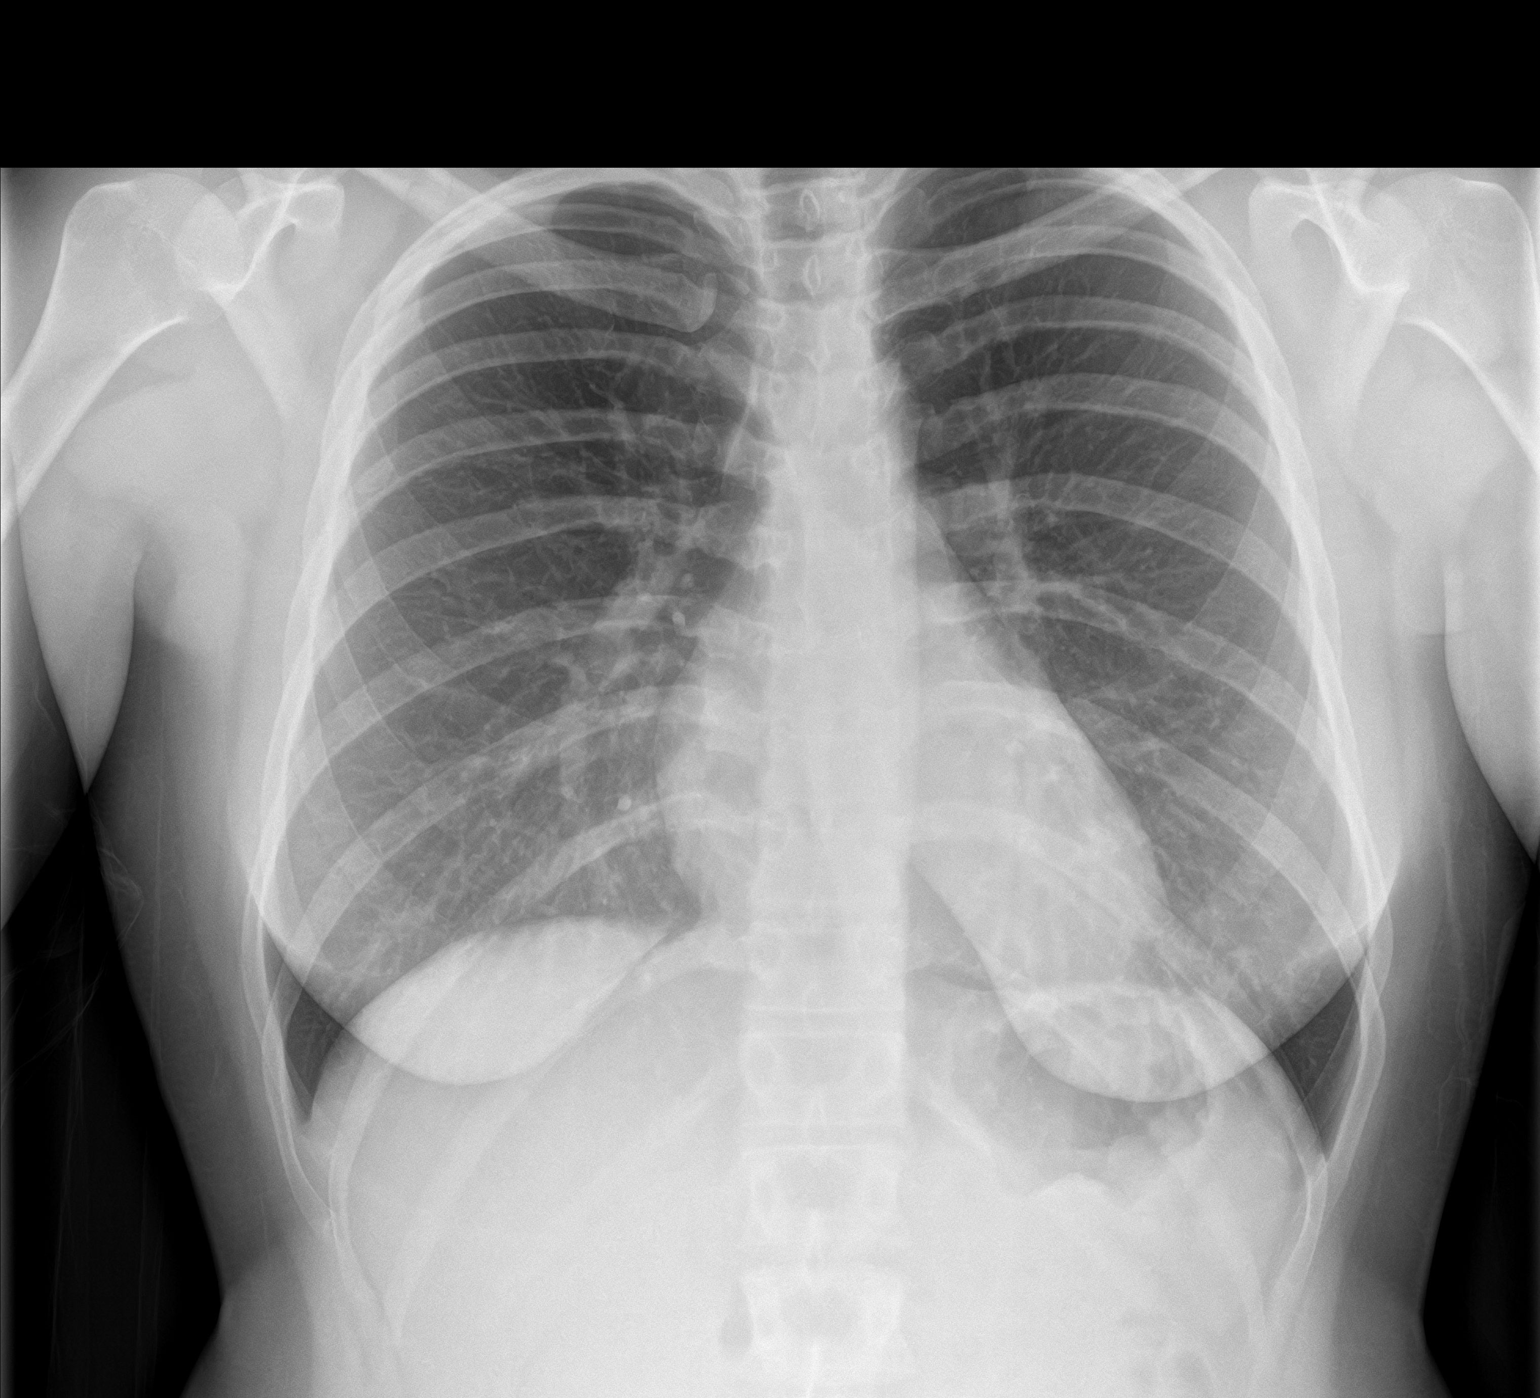
[im 2/2]
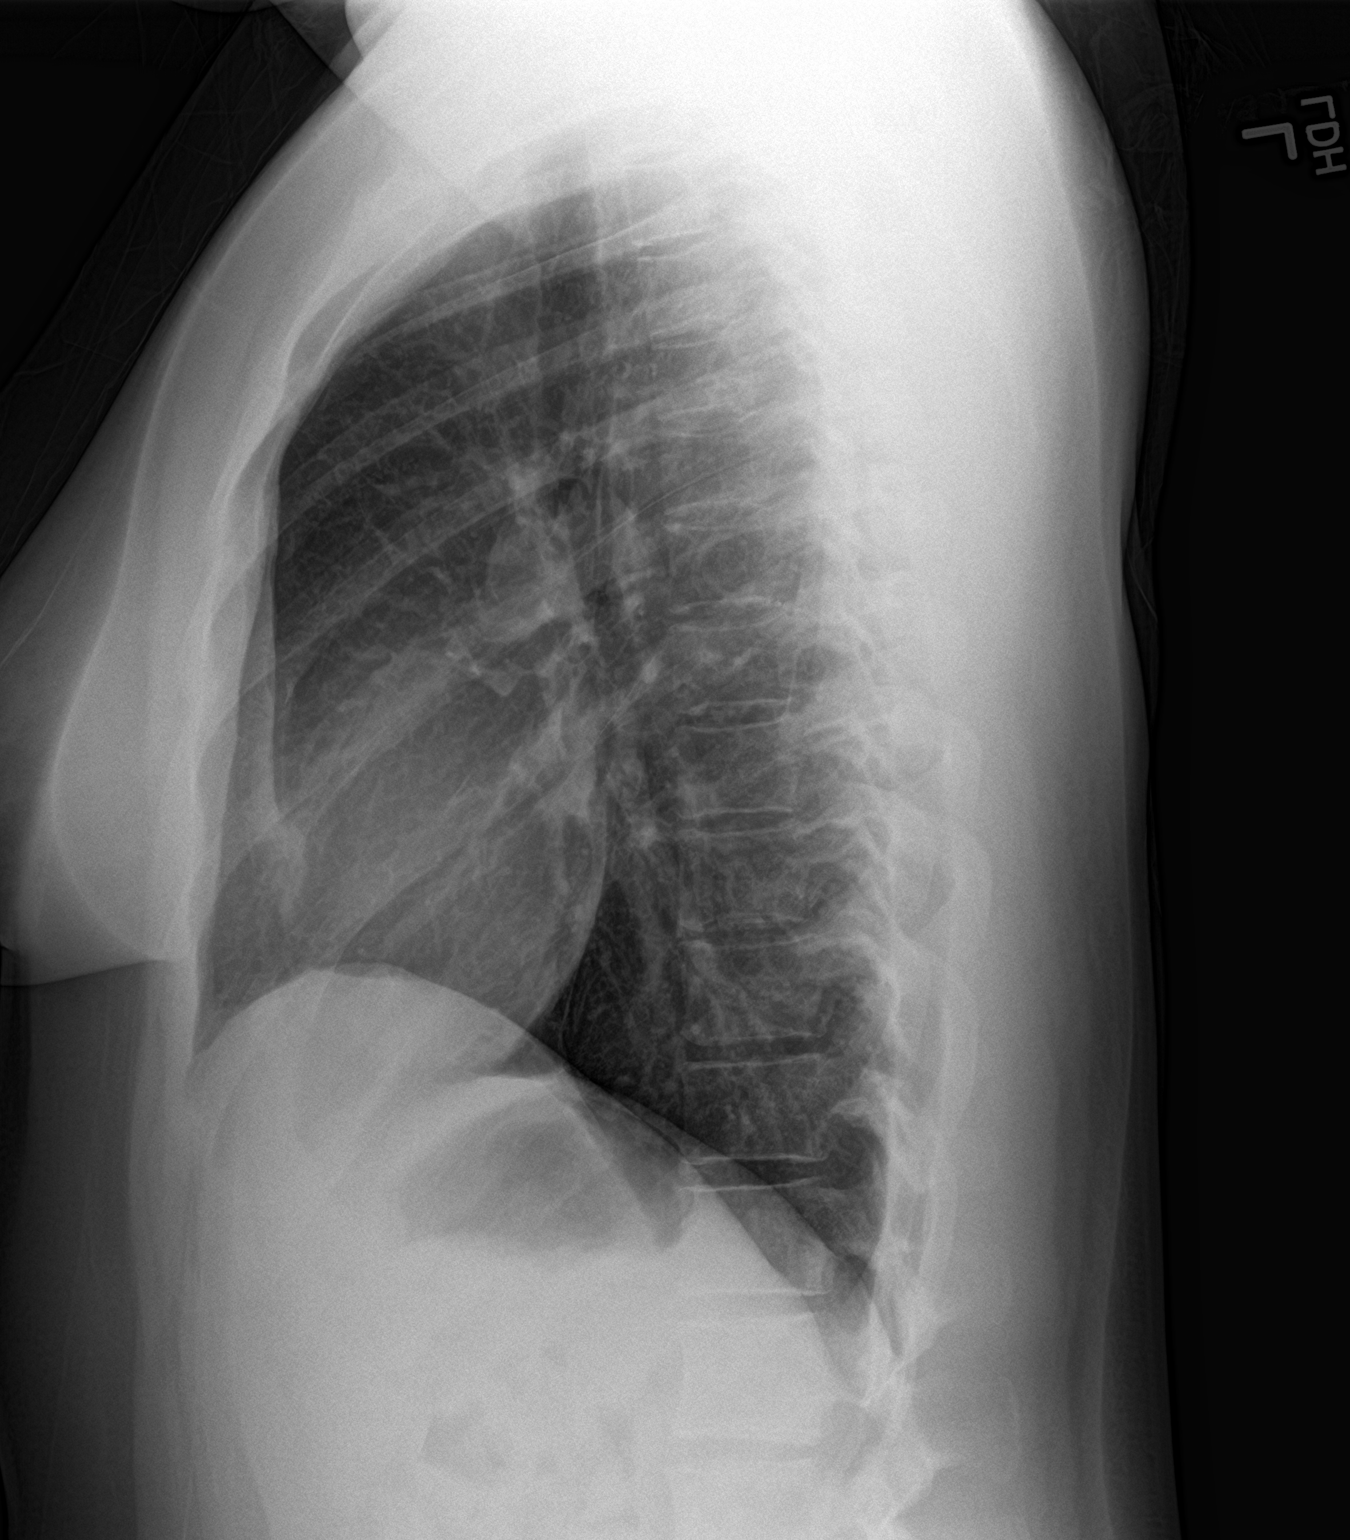

[2 of 2 positions shown; findings below may reference images not displayed]

FINDINGS: Lungs are clear. Heart size and pulmonary vascularity are normal. No
adenopathy. No pneumothorax. No bone lesions.
IMPRESSION: No abnormality noted.

## 2021-05-09 ENCOUNTER — Ambulatory Visit: Payer: Self-pay | Admitting: Physician Assistant

## 2021-05-09 ENCOUNTER — Encounter: Payer: Self-pay | Admitting: Physician Assistant

## 2021-05-09 ENCOUNTER — Other Ambulatory Visit: Payer: Self-pay

## 2021-05-09 DIAGNOSIS — Z113 Encounter for screening for infections with a predominantly sexual mode of transmission: Secondary | ICD-10-CM

## 2021-05-09 LAB — WET PREP FOR TRICH, YEAST, CLUE: Trichomonas Exam: NEGATIVE

## 2021-05-09 NOTE — Progress Notes (Signed)
Pt here for STD Screening.  Wet mount reviewed by Provider.  Pt declined condoms. Berdie Ogren, RN

## 2021-05-09 NOTE — Progress Notes (Signed)
[facility] STI clinic/screening visit  Subjective:  Claudia Morris is a 25 y.o. female being seen today for an STI screening visit. The patient reports they do not have symptoms.  Patient reports that they do not desire a pregnancy in the next year.   They reported they are not interested in discussing contraception today.  No LMP recorded. (Menstrual status: Oral contraceptives).   Patient has the following medical conditions:  There are no problems to display for this patient.   Chief Complaint  Patient presents with   SEXUALLY TRANSMITTED DISEASE    screening    HPI  Patient reports that she is not having any symptoms but would like a screening today.  States that she is taking Lexapro as directed and that she is using OCP, continuous dosing, as her BCM.  Reports last HIV test was in January of this year and is not sure whether she has had a pap yet.    See flowsheet for further details and programmatic requirements.    The following portions of the patient's history were reviewed and updated as appropriate: allergies, current medications, past medical history, past social history, past surgical history and problem list.  Objective:  There were no vitals filed for this visit.  Physical Exam Constitutional:      General: She is not in acute distress.    Appearance: Normal appearance.  HENT:     Head: Normocephalic and atraumatic.     Comments: No nits,lice, or hair loss. No cervical, supraclavicular or axillary adenopathy.     Mouth/Throat:     Mouth: Mucous membranes are moist.     Pharynx: Oropharynx is clear. No oropharyngeal exudate or posterior oropharyngeal erythema.  Eyes:     Conjunctiva/sclera: Conjunctivae normal.  Pulmonary:     Effort: Pulmonary effort is normal.  Abdominal:     Palpations: Abdomen is soft. There is no mass.     Tenderness: There is no abdominal tenderness. There is no guarding or rebound.  Genitourinary:     General: Normal vulva.     Rectum: Normal.     Comments: External genitalia/pubic area without nits, lice, edema, erythema, lesions and inguinal adenopathy. Vagina with normal mucosa and small amount of menstrual spotting present. Cervix without visible lesions. Uterus firm, mobile, nt, no masses, no CMT, no adnexal tenderness or fullness.  Musculoskeletal:     Cervical back: Neck supple. No tenderness.  Skin:    General: Skin is warm and dry.     Findings: No bruising, erythema, lesion or rash.  Neurological:     Mental Status: She is alert and oriented to person, place, and time.  Psychiatric:        Mood and Affect: Mood normal.        Behavior: Behavior normal.        Thought Content: Thought content normal.        Judgment: Judgment normal.     Assessment and Plan:  Claudia Morris is a 25 y.o. female presenting to the [facility] for STI screening  1. Screening for STD (sexually transmitted disease) Patient into clinic without symptoms. Reviewed with patient wet mount results.   Patient declines yeast cream today as is not having any symptoms and states that she will get some if she becomes symptomatic. Rec condoms with all sex. Await test results.  Counseled that RN will call if needs to RTC for treatment once results are back.  - WET PREP  FOR TRICH, YEAST, CLUE - Chlamydia/Gonorrhea [facility] - HIV [place] [facility] - Syphilis Serology, [facility] - Chlamydia/Gonorrhea [facility]     No follow-ups on file.  No future appointments.  Matt Holmes, PA

## 2021-05-11 LAB — HM HIV SCREENING LAB: HM HIV Screening: NEGATIVE

## 2021-12-01 ENCOUNTER — Ambulatory Visit
Admission: EM | Admit: 2021-12-01 | Discharge: 2021-12-01 | Disposition: A | Discharge status: Home / Self Care | Payer: BC Managed Care – PPO | Attending: Family Medicine | Admitting: Family Medicine

## 2021-12-01 ENCOUNTER — Other Ambulatory Visit: Payer: Self-pay

## 2021-12-01 ENCOUNTER — Encounter: Payer: Self-pay | Admitting: Emergency Medicine

## 2021-12-01 DIAGNOSIS — R21 Rash and other nonspecific skin eruption: Secondary | ICD-10-CM

## 2021-12-01 MED ORDER — TRIAMCINOLONE ACETONIDE 0.5 % EX OINT: 1.0000 "application " | TOPICAL_OINTMENT | Freq: Two times a day (BID) | CUTANEOUS | 0 refills | Status: DC

## 2021-12-01 MED ORDER — CETIRIZINE HCL 10 MG PO TABS: 10.0000 mg | ORAL_TABLET | Freq: Every day | ORAL | 0 refills | Status: AC

## 2021-12-01 NOTE — ED Provider Notes (Signed)
[facility]    CSN: [id number] Arrival date & time: 12/01/21  0802      History   Chief Complaint Chief Complaint  Patient presents with   Rash    HPI Claudia Morris is a 26 y.o. female who presents for evaluation of rash.  1.5 to 2-week history of rash.  Located diffusely.  It is itchy.  No known inciting factor.  However, she notes that she recently bought a new weighted blanket and also has been using chlorophyll and her water.  She is unsure of any other new changes.  No medications or interventions tried.  No relieving factors.  Past Medical History:  Diagnosis Date   Anxiety    GERD (gastroesophageal reflux disease)    Past Surgical History:  Procedure Laterality Date   ADENOIDECTOMY     TONSILLECTOMY      OB History   No obstetric history on file.      Home Medications    Prior to Admission medications   Medication Sig Start Date End Date Taking? Authorizing Provider  cetirizine (ZYRTEC ALLERGY) 10 MG tablet Take 1 tablet (10 mg total) by mouth daily. 12/01/21  Yes Maxie Slovacek, Barnie Del, DO  Norgestimate-Ethinyl Estradiol Triphasic 0.18/0.215/0.25 MG-35 MCG tablet Take 1 tablet by mouth daily.   Yes [provider]  triamcinolone ointment (KENALOG) 0.5 % Apply 1 application topically 2 (two) times daily. 12/01/21  Yes Jamyra Zweig G, DO  escitalopram (LEXAPRO) 5 MG tablet TK 1 T PO D 06/12/19   [provider]  norgestimate-ethinyl estradiol (ORTHO-CYCLEN) 0.25-35 MG-MCG tablet Take by mouth.  06/10/19  [provider]  omeprazole (PRILOSEC) 20 MG capsule TK ONE C PO D FOR REFLUX 04/16/19 06/10/19  [provider]  sucralfate (CARAFATE) 1 g tablet  04/19/19 06/10/19  [provider]    Family History Family History  Problem Relation Age of Onset   Diabetes Mother    Hypertension Father     Social History Social History   Tobacco Use   Smoking status: Never   Smokeless tobacco: Never  Vaping Use   Vaping Use:  Never used  Substance Use Topics   Alcohol use: Never   Drug use: Never     Allergies   Patient has no known allergies.   Review of Systems Review of Systems  Constitutional: Negative.   Skin:  Positive for rash.   Physical Exam Triage Vital Signs ED Triage Vitals  Enc Vitals Group     BP 12/01/21 0813 121/83     Pulse Rate 12/01/21 0813 79     Resp 12/01/21 0813 14     Temp 12/01/21 0813 98.9 F (37.2 C)     Temp Source 12/01/21 0813 Oral     SpO2 12/01/21 0813 98 %     Weight 12/01/21 0811 180 lb (81.6 kg)     Height 12/01/21 0811 5\' 7"  (1.702 m)     Head Circumference --      Peak Flow --      Pain Score 12/01/21 0811 0     Pain Loc --      Pain Edu? --      Excl. in [place]? --    Updated Vital Signs BP 121/83 (BP Location: Left Arm)    Pulse 79    Temp 98.9 F (37.2 C) (Oral)    Resp 14    Ht 5\' 7"  (1.702 m)    Wt 81.6 kg    LMP  11/10/2021 (Approximate)    SpO2 98%    BMI 28.19 kg/m   Visual Acuity Right Eye Distance:   Left Eye Distance:   Bilateral Distance:    Right Eye Near:   Left Eye Near:    Bilateral Near:     Physical Exam Vitals and nursing note reviewed.  Constitutional:      General: She is not in acute distress.    Appearance: Normal appearance. She is not ill-appearing.  HENT:     Head: Normocephalic and atraumatic.  Eyes:     General:        Right eye: No discharge.        Left eye: No discharge.     Conjunctiva/sclera: Conjunctivae normal.  Cardiovascular:     Rate and Rhythm: Normal rate and regular rhythm.  Pulmonary:     Effort: Pulmonary effort is normal.     Breath sounds: Normal breath sounds.  Skin:    Comments: Diffuse, scattered erythematous papular lesions.  Neurological:     Mental Status: She is alert.     UC Treatments / Results  Labs (all labs ordered are listed, but only abnormal results are displayed) Labs Reviewed - No data to display  EKG   Radiology No results found.  Procedures Procedures  (including critical care time)  Medications Ordered in UC Medications - No data to display  Initial Impression / Assessment and Plan / UC Course  I have reviewed the triage vital signs and the nursing notes.  Pertinent labs & imaging results that were available during my care of the patient were reviewed by me and considered in my medical decision making (see chart for details).    26 year old female presents with rash.  Appears to be contact allergic in nature.  Treating with triamcinolone and Zyrtec.  Information given regarding dermatology clinic if she fails to improve or worsens.  Final Clinical Impressions(s) / UC Diagnoses   Final diagnoses:  Rash and nonspecific skin eruption     Discharge Instructions      Medication as prescribed.  If continues to persist, contact dermatology @ [facility], [place], [place] [address] Phone: [phone]   ED Prescriptions     Medication Sig Dispense Auth. Provider   triamcinolone ointment (KENALOG) 0.5 % Apply 1 application topically 2 (two) times daily. 30 g Elfie Costanza G, DO   cetirizine (ZYRTEC ALLERGY) 10 MG tablet Take 1 tablet (10 mg total) by mouth daily. 30 tablet Coral Spikes, DO      PDMP not reviewed this encounter.   Thersa Salt [place], [place] 12/01/21 [phone]

## 2021-12-01 NOTE — Discharge Instructions (Signed)
Medication as prescribed.  If continues to persist, contact dermatology @ [facility] -  [address], [place], [place] [address] Phone: [phone]

## 2021-12-01 NOTE — ED Triage Notes (Signed)
Patient c/o red itchy bumps all over her body except her face that started 2 weeks ago.

## 2023-03-26 ENCOUNTER — Ambulatory Visit
Admission: EM | Admit: 2023-03-26 | Discharge: 2023-03-26 | Disposition: A | Discharge status: Home / Self Care | Payer: Medicaid Other | Attending: Family Medicine | Admitting: Family Medicine

## 2023-03-26 DIAGNOSIS — J029 Acute pharyngitis, unspecified: Secondary | ICD-10-CM | POA: Insufficient documentation

## 2023-03-26 LAB — GROUP A STREP BY PCR: Group A Strep by PCR: NOT DETECTED

## 2023-03-26 NOTE — ED Provider Notes (Signed)
[facility]    CSN: [id number] Arrival date & time: 03/26/23  0909      History   Chief Complaint Chief Complaint  Patient presents with   Sore Throat   Fever    HPI Claudia Morris is a 27 y.o. female.   HPI  History History obtained from the patient. Claudia Morris presents for sore throat and fever. Tmax 99.7 F. Took Advil yesterday.Marland Kitchen  Has slight stomach discomfort but denies nausea, vomiting, diarrhea. No cough, congestion or rhinorrhea.  No known sick contacts.           Past Medical History:  Diagnosis Date   Anxiety    GERD (gastroesophageal reflux disease)     There are no problems to display for this patient.   Past Surgical History:  Procedure Laterality Date   ADENOIDECTOMY     TONSILLECTOMY      OB History   No obstetric history on file.      Home Medications    Prior to Admission medications   Medication Sig Start Date End Date Taking? Authorizing Provider  cetirizine (ZYRTEC ALLERGY) 10 MG tablet Take 1 tablet (10 mg total) by mouth daily. 12/01/21  Yes Cook, Jayce G, DO  escitalopram (LEXAPRO) 5 MG tablet TK 1 T PO D 06/12/19  Yes [provider]  Norgestimate-Ethinyl Estradiol Triphasic 0.18/0.215/0.25 MG-35 MCG tablet Take 1 tablet by mouth daily.   Yes [provider]  triamcinolone ointment (KENALOG) 0.5 % Apply 1 application topically 2 (two) times daily. 12/01/21  Yes Cook, Jayce G, DO  norgestimate-ethinyl estradiol (ORTHO-CYCLEN) 0.25-35 MG-MCG tablet Take by mouth.  06/10/19  [provider]  omeprazole (PRILOSEC) 20 MG capsule TK ONE C PO D FOR REFLUX 04/16/19 06/10/19  [provider]  sucralfate (CARAFATE) 1 g tablet  04/19/19 06/10/19  [provider]    Family History Family History  Problem Relation Age of Onset   Diabetes Mother    Hypertension Father     Social History Social History   Tobacco Use   Smoking status: Never   Smokeless tobacco: Never  Vaping Use   Vaping Use:  Never used  Substance Use Topics   Alcohol use: Never   Drug use: Never     Allergies   Patient has no known allergies.   Review of Systems Review of Systems: negative unless otherwise stated in HPI.      Physical Exam Triage Vital Signs ED Triage Vitals  Enc Vitals Group     BP 03/26/23 0945 112/75     Pulse Rate 03/26/23 0945 95     Resp 03/26/23 0945 16     Temp 03/26/23 0945 99.4 F (37.4 C)     Temp Source 03/26/23 0945 Oral     SpO2 03/26/23 0945 97 %     Weight 03/26/23 0945 154 lb (69.9 kg)     Height 03/26/23 0945 5\' 7"  (1.702 m)     Head Circumference --      Peak Flow --      Pain Score 03/26/23 0950 3     Pain Loc --      Pain Edu? --      Excl. in [place]? --    No data found.  Updated Vital Signs BP 112/75 (BP Location: Right Arm)   Pulse 95   Temp 99.4 F (37.4 C) (Oral)   Resp 16   Ht 5\' 7"  (1.702 m)   Wt 69.9 kg   SpO2  97%   BMI 24.12 kg/m   Visual Acuity Right Eye Distance:   Left Eye Distance:   Bilateral Distance:    Right Eye Near:   Left Eye Near:    Bilateral Near:     Physical Exam GEN:     alert, well  appearing female in no distress    HENT:  mucus membranes moist, oropharyngeal without lesions or erythema, no tonsillar hypertrophy or exudates, no r nasal discharge, bilateral TM normal EYES:   pupils equal and reactive, no scleral injection or discharge NECK:  normal ROM, no lymphadenopathy, no meningismus   RESP:  no increased work of breathing, clear to auscultation bilaterally CVS:   regular rate and rhythm Skin:   warm and dry, no rash on visible skin    UC Treatments / Results  Labs (all labs ordered are listed, but only abnormal results are displayed) Labs Reviewed  GROUP A STREP BY PCR  CYTOLOGY, (ORAL, ANAL, URETHRAL) ANCILLARY ONLY    EKG   Radiology No results found.  Procedures Procedures (including critical care time)  Medications Ordered in UC Medications - No data to display  Initial  Impression / Assessment and Plan / UC Course  I have reviewed the triage vital signs and the nursing notes.  Pertinent labs & imaging results that were available during my care of the patient were reviewed by me and considered in my medical decision making (see chart for details).       Pt is a 27 y.o. female who presents for 1 day of respiratory symptoms. Claudia Morris is afebrile here without recent antipyretics. Satting well on room air. Overall pt is non-toxic appearing, well hydrated, without respiratory distress. Pulmonary exam is unremarkable. Strep PCR is negative. She is concerned she has a STI in her throat. Requests gonorrhea, chlamydia testing as she has anxiety after goggling her symptoms. Discussed that she does not likely have GC/CT in her throat and history most consistent with viral respiratory illness. She reports she will not have peace of mind without the STI testing for her throat. STI swab collected. Denies any vaginal concerns or symptoms.   Discussed symptomatic treatment for viral URI.  Explained lack of efficacy of antibiotics in viral disease.  Typical duration of symptoms discussed.   Return and ED precautions given and voiced understanding. Discussed MDM, treatment plan and plan for follow-up with patient who agrees with plan.     Final Clinical Impressions(s) / UC Diagnoses   Final diagnoses:  Pharyngitis, unspecified etiology     Discharge Instructions      Your strep test is negative. Your oral STD testing will resulted in the next 48-72 hours. We will contact you if your test results are positive. Look on MyChart for your results.    You can take Tylenol and/or Ibuprofen as needed for fever reduction and pain relief.    It is important to stay hydrated: drink plenty of fluids (water, gatorade/powerade/pedialyte, juices, or teas) to keep your throat moisturized and help further relieve irritation/discomfort.    Return or go to the Emergency Department if  symptoms worsen or do not improve in the next few days      ED Prescriptions   None    PDMP not reviewed this encounter.   Katha Cabal, DO 03/26/23 1202

## 2023-03-26 NOTE — Discharge Instructions (Signed)
Your strep test is negative. Your oral STD testing will resulted in the next 48-72 hours. We will contact you if your test results are positive. Look on MyChart for your results.    You can take Tylenol and/or Ibuprofen as needed for fever reduction and pain relief.    It is important to stay hydrated: drink plenty of fluids (water, gatorade/powerade/pedialyte, juices, or teas) to keep your throat moisturized and help further relieve irritation/discomfort.    Return or go to the Emergency Department if symptoms worsen or do not improve in the next few days

## 2023-03-26 NOTE — ED Triage Notes (Signed)
Pt c/o sore throat & fever x1 day. Tmax 99.7 around 0730 this AM. Has tried advil w/o relief.

## 2023-03-27 LAB — CYTOLOGY, (ORAL, ANAL, URETHRAL) ANCILLARY ONLY
Chlamydia: NEGATIVE
Comment: NEGATIVE
Comment: NORMAL
Neisseria Gonorrhea: NEGATIVE

## 2023-04-25 ENCOUNTER — Emergency Department
Admission: EM | Admit: 2023-04-25 | Discharge: 2023-04-25 | Discharge status: Against Medical Advice | Payer: Medicaid Other | Attending: Emergency Medicine | Admitting: Emergency Medicine

## 2023-04-25 ENCOUNTER — Other Ambulatory Visit: Payer: Self-pay

## 2023-04-25 DIAGNOSIS — Z5321 Procedure and treatment not carried out due to patient leaving prior to being seen by health care provider: Secondary | ICD-10-CM | POA: Insufficient documentation

## 2023-04-25 DIAGNOSIS — G43909 Migraine, unspecified, not intractable, without status migrainosus: Secondary | ICD-10-CM | POA: Diagnosis not present

## 2023-04-25 LAB — CBC WITH DIFFERENTIAL/PLATELET
Abs Immature Granulocytes: 0.01 10*3/uL (ref 0.00–0.07)
Basophils Absolute: 0 10*3/uL (ref 0.0–0.1)
Basophils Relative: 0 %
Eosinophils Absolute: 0 10*3/uL (ref 0.0–0.5)
Eosinophils Relative: 1 %
HCT: 39.4 % (ref 36.0–46.0)
Hemoglobin: 13 g/dL (ref 12.0–15.0)
Immature Granulocytes: 0 %
Lymphocytes Relative: 46 %
Lymphs Abs: 2.8 10*3/uL (ref 0.7–4.0)
MCH: 29.4 pg (ref 26.0–34.0)
MCHC: 33 g/dL (ref 30.0–36.0)
MCV: 89.1 fL (ref 80.0–100.0)
Monocytes Absolute: 0.3 10*3/uL (ref 0.1–1.0)
Monocytes Relative: 6 %
Neutro Abs: 2.9 10*3/uL (ref 1.7–7.7)
Neutrophils Relative %: 47 %
Platelets: 255 10*3/uL (ref 150–400)
RBC: 4.42 MIL/uL (ref 3.87–5.11)
RDW: 11.8 % (ref 11.5–15.5)
WBC: 6.1 10*3/uL (ref 4.0–10.5)
nRBC: 0 % (ref 0.0–0.2)

## 2023-04-25 LAB — COMPREHENSIVE METABOLIC PANEL WITH GFR
ALT: 14 U/L (ref 0–44)
AST: 18 U/L (ref 15–41)
Albumin: 4.2 g/dL (ref 3.5–5.0)
Alkaline Phosphatase: 37 U/L — ABNORMAL LOW (ref 38–126)
Anion gap: 9 (ref 5–15)
BUN: 9 mg/dL (ref 6–20)
CO2: 21 mmol/L — ABNORMAL LOW (ref 22–32)
Calcium: 8.7 mg/dL — ABNORMAL LOW (ref 8.9–10.3)
Chloride: 105 mmol/L (ref 98–111)
Creatinine, Ser: 0.82 mg/dL (ref 0.44–1.00)
GFR, Estimated: >60 mL/min
Glucose, Bld: 114 mg/dL — ABNORMAL HIGH (ref 70–99)
Potassium: 3.9 mmol/L (ref 3.5–5.1)
Sodium: 135 mmol/L (ref 135–145)
Total Bilirubin: 1 mg/dL (ref 0.3–1.2)
Total Protein: 7.6 g/dL (ref 6.5–8.1)

## 2023-04-25 NOTE — ED Triage Notes (Addendum)
POV with CC of migraine that started 2hours PTA - pain behind L eye. Pt has taken migraine med without relief. Pt has been seen x3 in the last week. Denies dizziness/CP but endorses nausea with vomiting. States this feels the same as typical migraines but pt is concerned that migraines are recurring so frequently.

## 2024-04-15 ENCOUNTER — Ambulatory Visit
Admission: EM | Admit: 2024-04-15 | Discharge: 2024-04-15 | Disposition: A | Discharge status: Home / Self Care | Attending: Nurse Practitioner | Admitting: Nurse Practitioner

## 2024-04-15 DIAGNOSIS — G43119 Migraine with aura, intractable, without status migrainosus: Secondary | ICD-10-CM

## 2024-04-15 HISTORY — DX: Migraine, unspecified, not intractable, without status migrainosus: G43.909

## 2024-04-15 MED ORDER — PROMETHAZINE HCL 25 MG PO TABS: 25.0000 mg | ORAL_TABLET | Freq: Four times a day (QID) | ORAL | 0 refills | Status: AC | PRN

## 2024-04-15 MED ORDER — METOCLOPRAMIDE HCL 5 MG/ML IJ SOLN
10.0000 mg | Freq: Once | INTRAMUSCULAR | Status: AC
  Administered 2024-04-15: 10 mg via INTRAMUSCULAR

## 2024-04-15 MED ORDER — KETOROLAC TROMETHAMINE 60 MG/2ML IM SOLN
60.0000 mg | Freq: Once | INTRAMUSCULAR | Status: AC
  Administered 2024-04-15: 60 mg via INTRAMUSCULAR

## 2024-04-15 MED ORDER — DEXAMETHASONE SODIUM PHOSPHATE 10 MG/ML IJ SOLN
10.0000 mg | Freq: Once | INTRAMUSCULAR | Status: AC
  Administered 2024-04-15: 10 mg via INTRAMUSCULAR

## 2024-04-15 NOTE — ED Triage Notes (Signed)
 Migraine since Sat. Patient was seen at [facility]. Was given migraine cocktail-steroid-valium and fluids. Migraine went away and came back this morning. Patient has taken Imitrex 25 mg and fioricet this morning with no relief. Sensitivity to light.  Patient doesn't have neurologist. But does have PCP.

## 2024-04-15 NOTE — ED Provider Notes (Signed)
 [facility]    CSN: [id number] Arrival date & time: 04/15/24  1056      History   Chief Complaint Chief Complaint  Patient presents with   Migraine    HPI Claudia Morris is a 28 y.o. female.   Claudia Morris is a 28 y.o. female that presents with a migraine headache that started on Saturday. The patient has a history of migraines, though they are infrequent, with the last occurrence being a year ago, and prior to that, six months and three years ago, respectively. The current migraine episode began on Saturday, prompting an ER visit where the patient received Toradol, Benadryl, prednisone , Valium, and IV fluids. This treatment reduced the headache intensity to a 1 or 2 out of 10. However, the headache returned on Tuesday, at which point the patient took sumatriptan and Fioricet, which provided relief. Today, the headache recurred, and the same medications (sumatriptan and Fioricet) were ineffective when taken this morning. The patient describes the headache as consistent with previous episodes, typically unilateral and located behind the eye or forehead. The migraine is preceded by an aura, characterized by blind spots and lights in the corner of vision, occurring approximately 20 minutes before headache onset. Associated symptoms include nausea, light sensitivity, and discomfort in the back of the neck. The patient denies vomiting, dizziness, neck pain, or neck stiffness. No confusion, slurred speech, numbness or tingling in the face, hands or feet, problems swallowing or speaking, chest pain, or shortness of breath.  The patient reports this is not the worst headache they have ever experienced but is seeking care to prevent it from worsening. They have a history of ER visits for migraines in July 2021, December 2023, and June 2024. A CT scan of the head performed during the June 2024 visit was normal.  The following portions of the patient's history were reviewed and updated as  appropriate: allergies, current medications, past family history, past medical history, past social history, past surgical history, and problem list.        Past Medical History:  Diagnosis Date   Anxiety    GERD (gastroesophageal reflux disease)    Migraines     There are no active problems to display for this patient.   Past Surgical History:  Procedure Laterality Date   ADENOIDECTOMY     TONSILLECTOMY      OB History   No obstetric history on file.      Home Medications    Prior to Admission medications   Medication Sig Start Date End Date Taking? Authorizing Provider  Norgestimate-Ethinyl Estradiol Triphasic 0.18/0.215/0.25 MG-35 MCG tablet Take 1 tablet by mouth daily.   Yes [provider]  promethazine (PHENERGAN) 25 MG tablet Take 1 tablet (25 mg total) by mouth every 6 (six) hours as needed for nausea or vomiting. 04/15/24  Yes Maryruth Sol, FNP  cetirizine  (ZYRTEC  ALLERGY) 10 MG tablet Take 1 tablet (10 mg total) by mouth daily. 12/01/21   Cook, Jayce G, DO  escitalopram (LEXAPRO) 5 MG tablet TK 1 T PO D 06/12/19   [provider]  triamcinolone  ointment (KENALOG ) 0.5 % Apply 1 application topically 2 (two) times daily. 12/01/21   Cook, Jayce G, DO  norgestimate-ethinyl estradiol (ORTHO-CYCLEN) 0.25-35 MG-MCG tablet Take by mouth.  06/10/19  [provider]  omeprazole (PRILOSEC) 20 MG capsule TK ONE C PO D FOR REFLUX 04/16/19 06/10/19  [provider]  sucralfate (CARAFATE) 1 g tablet  04/19/19 06/10/19  [provider]    Family History Family History  Problem Relation Age of Onset   Diabetes Mother    Hypertension Father     Social History Social History   Tobacco Use   Smoking status: Never   Smokeless tobacco: Never  Vaping Use   Vaping status: Never Used  Substance Use Topics   Alcohol use: Never   Drug use: Never     Allergies   Patient has no known allergies.   Review of Systems Review of  Systems  Constitutional:  Negative for diaphoresis, fatigue and fever.  Eyes:  Positive for photophobia. Negative for visual disturbance.  Gastrointestinal:  Positive for nausea. Negative for vomiting.  Musculoskeletal:  Positive for neck pain. Negative for neck stiffness.  Skin:  Negative for rash.  Neurological:  Positive for headaches. Negative for dizziness, speech difficulty, weakness and numbness.  All other systems reviewed and are negative.    Physical Exam Triage Vital Signs ED Triage Vitals  Encounter Vitals Group     BP 04/15/24 1117 110/80     Girls Systolic BP Percentile --      Girls Diastolic BP Percentile --      Boys Systolic BP Percentile --      Boys Diastolic BP Percentile --      Pulse Rate 04/15/24 1117 75     Resp 04/15/24 1117 15     Temp 04/15/24 1117 99.6 F (37.6 C)     Temp Source 04/15/24 1117 Oral     SpO2 04/15/24 1117 100 %     Weight --      Height --      Head Circumference --      Peak Flow --      Pain Score 04/15/24 1116 6     Pain Loc --      Pain Education --      Exclude from Growth Chart --    No data found.  Updated Vital Signs BP 110/80 (BP Location: Left Arm)   Pulse 75   Temp 99.6 F (37.6 C) (Oral)   Resp 15   LMP 03/29/2024 (Exact Date)   SpO2 100%   Visual Acuity Right Eye Distance:   Left Eye Distance:   Bilateral Distance:    Right Eye Near:   Left Eye Near:    Bilateral Near:     Physical Exam Vitals reviewed.  Constitutional:      General: She is awake. She is not in acute distress.    Appearance: Normal appearance. She is well-developed. She is not ill-appearing, toxic-appearing or diaphoretic.  HENT:     Head: Normocephalic.     Nose: Nose normal.     Mouth/Throat:     Mouth: Mucous membranes are moist.   Eyes:     General: Vision grossly intact.     Extraocular Movements: Extraocular movements intact.     Right eye: No nystagmus.     Left eye: No nystagmus.     Conjunctiva/sclera:  Conjunctivae normal.   Neck:     Trachea: Trachea normal.     Meningeal: Brudzinski's sign and Kernig's sign absent.   Cardiovascular:     Rate and Rhythm: Normal rate.     Heart sounds: Normal heart sounds.  Pulmonary:     Effort: Pulmonary effort is normal.     Breath sounds: Normal breath sounds.  Abdominal:     Palpations: Abdomen is soft.   Musculoskeletal:        General:  Normal range of motion.     Cervical back: Normal range of motion and neck supple. No rigidity or tenderness.   Skin:    General: Skin is warm and dry.   Neurological:     General: No focal deficit present.     Mental Status: She is alert and oriented to person, place, and time.     Cranial Nerves: No cranial nerve deficit.     Sensory: Sensation is intact. No sensory deficit.     Motor: Motor function is intact. No weakness.     Coordination: Coordination is intact.     Gait: Gait is intact.   Psychiatric:        Behavior: Behavior is cooperative.      UC Treatments / Results  Labs (all labs ordered are listed, but only abnormal results are displayed) Labs Reviewed - No data to display  EKG   Radiology No results found.  Procedures Procedures (including critical care time)  Medications Ordered in UC Medications  ketorolac (TORADOL) injection 60 mg (60 mg Intramuscular Given 04/15/24 1141)  metoCLOPramide (REGLAN) injection 10 mg (10 mg Intramuscular Given 04/15/24 1141)  dexamethasone (DECADRON) injection 10 mg (10 mg Intramuscular Given 04/15/24 1141)    Initial Impression / Assessment and Plan / UC Course  I have reviewed the triage vital signs and the nursing notes.  Pertinent labs & imaging results that were available during my care of the patient were reviewed by me and considered in my medical decision making (see chart for details).     Patient presents with a recurrent migraine headache that began on Saturday, consistent with her history of migraines. Each episode is  preceded by an aura approximately 20 minutes before onset and accompanied by nausea and photophobia. She was evaluated in the ER on Saturday and treated with Toradol, Benadryl, prednisone , Valium, and IV fluids, which reduced her pain to 1-2/10. The headache returned on Tuesday and responded to sumatriptan and Fioricet; however, today's episode did not improve with the same regimen. She denies vomiting, confusion, slurred speech, numbness, tingling, difficulty speaking or swallowing, chest pain, or shortness of breath. A migraine cocktail consisting of Toradol, Reglan, and Decadron was administered in clinic. The patient may continue Fioricet, with the next dose scheduled for 3:30 p.m. after taking the last dose around 9:30 a.m. She was instructed to take two tablets at 3:30, then one tablet every 4-6 hours as needed, not exceeding six tablets in 24 hours. Follow-up with her PCP at [facility] is recommended if migraines increase in frequency, with potential neurology referral to be coordinated through her PCP. ED precautions were reviewed.  Today's evaluation has revealed no signs of a dangerous process. Discussed diagnosis with patient and/or guardian. Patient and/or guardian aware of their diagnosis, possible red flag symptoms to watch out for and need for close follow up. Patient and/or guardian understands verbal and written discharge instructions. Patient and/or guardian comfortable with plan and disposition.  Patient and/or guardian has a clear mental status at this time, good insight into illness (after discussion and teaching) and has clear judgment to make decisions regarding their care  Documentation was completed with the aid of voice recognition software. Transcription may contain typographical errors. Final Clinical Impressions(s) / UC Diagnoses   Final diagnoses:  Migraine with aura, intractable, without status migrainosus     Discharge Instructions      You were seen today  for a recurrent migraine headache. In the clinic today, you were given  a migraine cocktail with Toradol, Reglan, and Decadron for symptom relief.  You may continue using Fioricet as needed. Your next dose can be taken at 3:30 p.m., with two tablets at that time. After that, you may take one tablet every 4 to 6 hours as needed for pain, but do not exceed six tablets or capsules within 24 hours. I have prescribed you a medication called phenergan that you may take as needed for nausea and vomiting. Make sure you drink plenty of fluids, rest in a dark, quiet room, and avoid known migraine triggers such as stress, certain foods, or strong smells.  Follow up with your primary care provider at [facility] if your headaches become more frequent, harder to control, or begin to interfere with daily activities. Your provider may refer you to a neurologist for further evaluation if needed.  Go to the emergency room if you experience new or worsening symptoms such as severe or sudden headache unlike your usual migraines, confusion, slurred speech, vision loss, weakness or numbness in your face or limbs, difficulty speaking or swallowing, chest pain, or shortness of breath.      ED Prescriptions     Medication Sig Dispense Auth. Provider   promethazine (PHENERGAN) 25 MG tablet Take 1 tablet (25 mg total) by mouth every 6 (six) hours as needed for nausea or vomiting. 12 tablet Maryruth Sol, FNP      PDMP not reviewed this encounter.   Maryruth Sol, [place] 04/15/24 1147

## 2024-04-15 NOTE — Discharge Instructions (Addendum)
 You were seen today for a recurrent migraine headache. In the clinic today, you were given a migraine cocktail with Toradol, Reglan, and Decadron for symptom relief.  You may continue using Fioricet as needed. Your next dose can be taken at 3:30 p.m., with two tablets at that time. After that, you may take one tablet every 4 to 6 hours as needed for pain, but do not exceed six tablets or capsules within 24 hours. I have prescribed you a medication called phenergan that you may take as needed for nausea and vomiting. Make sure you drink plenty of fluids, rest in a dark, quiet room, and avoid known migraine triggers such as stress, certain foods, or strong smells.  Follow up with your primary care provider at [facility] if your headaches become more frequent, harder to control, or begin to interfere with daily activities. Your provider may refer you to a neurologist for further evaluation if needed.  Go to the emergency room if you experience new or worsening symptoms such as severe or sudden headache unlike your usual migraines, confusion, slurred speech, vision loss, weakness or numbness in your face or limbs, difficulty speaking or swallowing, chest pain, or shortness of breath.

## 2024-05-15 ENCOUNTER — Encounter: Payer: Self-pay | Admitting: Emergency Medicine

## 2024-05-15 ENCOUNTER — Ambulatory Visit
Admission: EM | Admit: 2024-05-15 | Discharge: 2024-05-15 | Disposition: A | Discharge status: Home / Self Care | Attending: Emergency Medicine | Admitting: Emergency Medicine

## 2024-05-15 DIAGNOSIS — S81811A Laceration without foreign body, right lower leg, initial encounter: Secondary | ICD-10-CM

## 2024-05-15 DIAGNOSIS — Z23 Encounter for immunization: Secondary | ICD-10-CM | POA: Diagnosis not present

## 2024-05-15 MED ORDER — TETANUS-DIPHTH-ACELL PERTUSSIS 5-2.5-18.5 LF-MCG/0.5 IM SUSY
0.5000 mL | PREFILLED_SYRINGE | Freq: Once | INTRAMUSCULAR | Status: AC
  Administered 2024-05-15: 0.5 mL via INTRAMUSCULAR

## 2024-05-15 MED ORDER — AMOXICILLIN-POT CLAVULANATE 875-125 MG PO TABS: 1.0000 | ORAL_TABLET | Freq: Two times a day (BID) | ORAL | 0 refills | Status: AC

## 2024-05-15 NOTE — ED Triage Notes (Signed)
 Patient states that she cut her right lower leg on a nail around 9:15 am this morning.  Patient has a laceration to her right lower leg no bleeding at this time.  Paitent is not UTD on tetanus.

## 2024-05-15 NOTE — ED Provider Notes (Signed)
 [facility]    CSN: [id number] Arrival date & time: 05/15/24  1044      History   Chief Complaint Chief Complaint  Patient presents with   Laceration    Right lower leg    HPI Claudia Morris is a 28 y.o. female.   HPI  28 year old female with past medical history significant for anxiety, GERD, and migraine headaches presents for evaluation of a laceration to her right lower leg that she sustained at 915 this morning due to a rusty nail.  She is unsure when her last tetanus shot was but she thinks it was 2016.  No active bleeding at this time.  Past Medical History:  Diagnosis Date   Anxiety    GERD (gastroesophageal reflux disease)    Migraines     There are no active problems to display for this patient.   Past Surgical History:  Procedure Laterality Date   ADENOIDECTOMY     TONSILLECTOMY      OB History   No obstetric history on file.      Home Medications    Prior to Admission medications   Medication Sig Start Date End Date Taking? Authorizing Provider  amoxicillin -clavulanate (AUGMENTIN ) 875-125 MG tablet Take 1 tablet by mouth every 12 (twelve) hours for 7 days. 05/15/24 05/22/24 Yes Bernardino Ditch, NP  cetirizine  (ZYRTEC  ALLERGY) 10 MG tablet Take 1 tablet (10 mg total) by mouth daily. 12/01/21   Cook, Jayce G, DO  Norgestimate-Ethinyl Estradiol Triphasic 0.18/0.215/0.25 MG-35 MCG tablet Take 1 tablet by mouth daily.    [provider]  promethazine  (PHENERGAN ) 25 MG tablet Take 1 tablet (25 mg total) by mouth every 6 (six) hours as needed for nausea or vomiting. 04/15/24   Iola Lukes, FNP  norgestimate-ethinyl estradiol (ORTHO-CYCLEN) 0.25-35 MG-MCG tablet Take by mouth.  06/10/19  [provider]  omeprazole (PRILOSEC) 20 MG capsule TK ONE C PO D FOR REFLUX 04/16/19 06/10/19  [provider]  sucralfate (CARAFATE) 1 g tablet  04/19/19 06/10/19  [provider]    Family History Family History  Problem  Relation Age of Onset   Diabetes Mother    Hypertension Father     Social History Social History   Tobacco Use   Smoking status: Never   Smokeless tobacco: Never  Vaping Use   Vaping status: Never Used  Substance Use Topics   Alcohol use: Never   Drug use: Never     Allergies   Patient has no known allergies.   Review of Systems Review of Systems  Constitutional:  Negative for fever.  Skin:  Positive for wound. Negative for color change.     Physical Exam Triage Vital Signs ED Triage Vitals  Encounter Vitals Group     BP      Girls Systolic BP Percentile      Girls Diastolic BP Percentile      Boys Systolic BP Percentile      Boys Diastolic BP Percentile      Pulse      Resp      Temp      Temp src      SpO2      Weight      Height      Head Circumference      Peak Flow      Pain Score      Pain Loc      Pain Education      Exclude from Growth Chart  No data found.  Updated Vital Signs BP 113/80 (BP Location: Right Arm)   Pulse 74   Temp 98.3 F (36.8 C) (Oral)   Resp 14   Ht 5' 6 (1.676 m)   Wt 160 lb (72.6 kg)   LMP 05/01/2024 (Approximate)   SpO2 96%   BMI 25.82 kg/m   Visual Acuity Right Eye Distance:   Left Eye Distance:   Bilateral Distance:    Right Eye Near:   Left Eye Near:    Bilateral Near:     Physical Exam Vitals and nursing note reviewed.  Constitutional:      Appearance: Normal appearance. She is not ill-appearing.  HENT:     Head: Normocephalic and atraumatic.  Skin:    General: Skin is warm and dry.     Capillary Refill: Capillary refill takes less than 2 seconds.     Findings: No erythema.  Neurological:     General: No focal deficit present.     Mental Status: She is alert and oriented to person, place, and time.      UC Treatments / Results  Labs (all labs ordered are listed, but only abnormal results are displayed) Labs Reviewed - No data to display  EKG   Radiology No results  found.  Procedures Procedures (including critical care time)  Medications Ordered in UC Medications  Tdap (BOOSTRIX) injection 0.5 mL (0.5 mLs Intramuscular Given 05/15/24 1104)    Initial Impression / Assessment and Plan / UC Course  I have reviewed the triage vital signs and the nursing notes.  Pertinent labs & imaging results that were available during my care of the patient were reviewed by me and considered in my medical decision making (see chart for details).   Patient is a pleasant, nontoxic-appearing 28 year old female presenting for evaluation of a superficial laceration she sustained to the lateral aspect back of her proximal right lower extremity this morning.  As you can see in image above, the wound is very superficial and does not extend down through the dermal layer.  There is some dried blood.  I will have staff clean the wound and dress it with bacitracin, nonadherent dressing, and Coban.  I will also have staff update her tetanus shot.  I will discharge her home on a 7-day course of Augmentin  to prevent infection.  Return precautions reviewed.   Final Clinical Impressions(s) / UC Diagnoses   Final diagnoses:  Laceration of right lower extremity, initial encounter     Discharge Instructions      Keep the wound on your right lower leg clean and dry.  Clean it with warm water and soap.  You do not need to use alcohol or hydrogen peroxide.  Apply a thin smear of bacitracin or Neosporin twice daily for the next couple days until a good scab has formed.  Once the scab has formed you may leave the area open to air when at home and cover it with a dry dressing when out in public.  I would also apply sunscreen to the wound to prevent hyperpigmentation and minimize the appearance of scarring.  Take the Augmentin  875 mg twice daily with food for 7 days to prevent infection from the laceration.  If you develop any surrounding redness, swelling, pus drainage from the wound,  red streaks going up your leg, or fevers please return for reevaluation or see your primary care provider.     ED Prescriptions     Medication Sig Dispense Auth. Provider  amoxicillin -clavulanate (AUGMENTIN ) 875-125 MG tablet Take 1 tablet by mouth every 12 (twelve) hours for 7 days. 14 tablet Bernardino Ditch, NP      PDMP not reviewed this encounter.   Bernardino Ditch, NP 05/15/24 1113

## 2024-05-15 NOTE — Discharge Instructions (Addendum)
 Keep the wound on your right lower leg clean and dry.  Clean it with warm water and soap.  You do not need to use alcohol or hydrogen peroxide.  Apply a thin smear of bacitracin or Neosporin twice daily for the next couple days until a good scab has formed.  Once the scab has formed you may leave the area open to air when at home and cover it with a dry dressing when out in public.  I would also apply sunscreen to the wound to prevent hyperpigmentation and minimize the appearance of scarring.  Take the Augmentin  875 mg twice daily with food for 7 days to prevent infection from the laceration.  If you develop any surrounding redness, swelling, pus drainage from the wound, red streaks going up your leg, or fevers please return for reevaluation or see your primary care provider.

## 2024-07-04 ENCOUNTER — Ambulatory Visit
Admission: EM | Admit: 2024-07-04 | Discharge: 2024-07-04 | Disposition: A | Discharge status: Home / Self Care | Attending: Physician Assistant | Admitting: Physician Assistant

## 2024-07-04 ENCOUNTER — Ambulatory Visit (INDEPENDENT_AMBULATORY_CARE_PROVIDER_SITE_OTHER)

## 2024-07-04 DIAGNOSIS — M25561 Pain in right knee: Secondary | ICD-10-CM

## 2024-07-04 NOTE — ED Triage Notes (Signed)
 Pt c/o right knee pain x4days  Pt states that she did track in high school, and wanted to get back into shape before her 30s.  Pt states that she decided to do sprints and felt her right knee shift.  Pt woke up with knee pain and has discomfort on the medial side of the knee and when bending her leg.

## 2024-07-04 NOTE — ED Provider Notes (Signed)
 [facility]    CSN: [id number] Arrival date & time: 07/04/24  1149      History   Chief Complaint Chief Complaint  Patient presents with   Knee Pain    HPI Claudia Morris is a 28 y.o. female presenting for 4 day history of right knee pain. Denies swelling. Knee feels a little weak and unsteady. Patient denies fall or injury. She recently started sprinting again after some time off. She has been icing knee, taking ibuprofen and using kinesiology tape. 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   HPI  Past Medical History:  Diagnosis Date   Anxiety    GERD (gastroesophageal reflux disease)    Migraines     There are no active problems to display for this patient.   Past Surgical History:  Procedure Laterality Date   ADENOIDECTOMY     TONSILLECTOMY      OB History   No obstetric history on file.      Home Medications    Prior to Admission medications   Medication Sig Start Date End Date Taking? Authorizing Provider  cetirizine  (ZYRTEC  ALLERGY) 10 MG tablet Take 1 tablet (10 mg total) by mouth daily. 12/01/21  Yes Cook, Jayce G, DO  Norgestimate-Ethinyl Estradiol Triphasic 0.18/0.215/0.25 MG-35 MCG tablet Take 1 tablet by mouth daily.   Yes [provider]  promethazine  (PHENERGAN ) 25 MG tablet Take 1 tablet (25 mg total) by mouth every 6 (six) hours as needed for nausea or vomiting. 04/15/24  Yes Iola Lukes, FNP  norgestimate-ethinyl estradiol (ORTHO-CYCLEN) 0.25-35 MG-MCG tablet Take by mouth.  06/10/19  [provider]  omeprazole (PRILOSEC) 20 MG capsule TK ONE C PO D FOR REFLUX 04/16/19 06/10/19  [provider]  sucralfate (CARAFATE) 1 g tablet  04/19/19 06/10/19  [provider]    Family History Family History  Problem Relation Age of Onset   Diabetes Mother    Hypertension Father     Social History Social History   Tobacco Use   Smoking status: Never    Smokeless tobacco: Never  Vaping Use   Vaping status: Never Used  Substance Use Topics   Alcohol use: Never   Drug use: Never     Allergies   Patient has no known allergies.   Review of Systems Review of Systems  Musculoskeletal:  Positive for arthralgias. Negative for gait problem and joint swelling.  Skin:  Negative for color change and wound.  Neurological:  Positive for weakness (mild weakness right knee). Negative for numbness.     Physical Exam Triage Vital Signs ED Triage Vitals [07/04/24 1242]  Encounter Vitals Group     BP 117/76     Girls Systolic BP Percentile      Girls Diastolic BP Percentile      Boys Systolic BP Percentile      Boys Diastolic BP Percentile      Pulse Rate 69     Resp      Temp 98.3 F (36.8 C)     Temp Source Oral     SpO2 100 %     Weight 161 lb 3.2 oz (73.1 kg)     Height      Head Circumference      Peak Flow      Pain Score 7     Pain Loc      Pain Education      Exclude from Growth Chart  No data found.  Updated Vital Signs BP 117/76 (BP Location: Left Arm)   Pulse 69   Temp 98.3 F (36.8 C) (Oral)   Wt 161 lb 3.2 oz (73.1 kg)   LMP 06/13/2024   SpO2 100%   BMI 26.02 kg/m     Physical Exam Vitals and nursing note reviewed.  Constitutional:      General: She is not in acute distress.    Appearance: Normal appearance. She is not ill-appearing or toxic-appearing.  HENT:     Head: Normocephalic and atraumatic.  Eyes:     General: No scleral icterus.       Right eye: No discharge.        Left eye: No discharge.     Conjunctiva/sclera: Conjunctivae normal.  Cardiovascular:     Rate and Rhythm: Normal rate.  Pulmonary:     Effort: Pulmonary effort is normal. No respiratory distress.  Musculoskeletal:     Cervical back: Neck supple.     Right knee: Swelling (mild medial swelling) present. No erythema or ecchymosis. Decreased range of motion. Tenderness present over the medial joint line.  Skin:    General:  Skin is dry.  Neurological:     General: No focal deficit present.     Mental Status: She is alert. Mental status is at baseline.     Motor: No weakness.     Gait: Gait normal.  Psychiatric:        Mood and Affect: Mood normal.        Behavior: Behavior normal.      UC Treatments / Results  Labs (all labs ordered are listed, but only abnormal results are displayed) Labs Reviewed - No data to display  EKG   Radiology DG Knee Complete 4 Views Right Result Date: 07/04/2024 EXAM: 4 or more VIEW(S) XRAY OF THE RIGHT KNEE 07/04/2024 01:05:00 PM COMPARISON: None available. CLINICAL HISTORY: Pain after running. Patient complains of right knee pain for 4 days. FINDINGS: BONES AND JOINTS: No acute fracture. Corticated density adjacent to the anterior tibial tubercle, chronic. Joint spaces and alignment are normal. No focal osseous lesion. No significant joint effusion. No significant degenerative changes. SOFT TISSUES: The soft tissues are unremarkable. IMPRESSION: 1. No acute findings. Electronically signed by: Andrea Gasman MD 07/04/2024 01:09 PM EDT RP Workstation: [id number]    Procedures Procedures (including critical care time)  Medications Ordered in UC Medications - No data to display  Initial Impression / Assessment and Plan / UC Course  I have reviewed the triage vital signs and the nursing notes.  Pertinent labs & imaging results that were available during my care of the patient were reviewed by me and considered in my medical decision making (see chart for details).   28 year old female presents for right knee pain for the past 4 days.  Patient denies any fall or injury.  Recently started sprinting again.  States she noticed pain in the knee after sprinting the day before.  Has been using kinesiology tape, taking ibuprofen and icing knee and this has helped.  X-ray of right knee obtained shows no acute abnormality.  Reviewed with patient.  Acute right knee pain.  Possible  sprain/strain.  Continue Motrin.  Offered a knee brace but she has a hinged knee brace at home already that she will use if needed for weakness.  Advised RICE guidelines.  Tylenol as needed.  Reviewed [facility] follow-up if weakness worsens, is not improving or pain worsens.  Patient is agreeable.  Final Clinical Impressions(s) / UC Diagnoses   Final diagnoses:  Acute pain of right knee     Discharge Instructions      -Knee xray was normal.   SPRAIN: Stressed avoiding painful activities . Reviewed RICE guidelines. Use medications as directed, including NSAIDs. If no NSAIDs have been prescribed for you today, you may take Aleve or Motrin over the counter. May use Tylenol in between doses of NSAIDs.  If no improvement in the next 1-2 weeks, f/u with PCP or return to our office for reexamination, and please feel free to call or return at any time for any questions or concerns you may have and we will be happy to help you!       ED Prescriptions   None    PDMP not reviewed this encounter.   Arvis Jolan NOVAK, PA-C 07/04/24 1325

## 2024-07-04 NOTE — Discharge Instructions (Addendum)
-  Knee xray was normal.   SPRAIN: Stressed avoiding painful activities . Reviewed RICE guidelines. Use medications as directed, including NSAIDs. If no NSAIDs have been prescribed for you today, you may take Aleve or Motrin over the counter. May use Tylenol in between doses of NSAIDs.  If no improvement in the next 1-2 weeks, f/u with PCP or return to our office for reexamination, and please feel free to call or return at any time for any questions or concerns you may have and we will be happy to help you!
